# Patient Record
Sex: Female | Born: 1970 | Race: White | Hispanic: No | Smoking: Current every day smoker
Health system: Southern US, Community
[De-identification: ages and names within clinical notes are randomized; demographics above are authoritative.]

## PROBLEM LIST (undated history)

## (undated) DIAGNOSIS — F32A Depression, unspecified: Secondary | ICD-10-CM

## (undated) DIAGNOSIS — G4700 Insomnia, unspecified: Secondary | ICD-10-CM

## (undated) DIAGNOSIS — I10 Essential (primary) hypertension: Secondary | ICD-10-CM

## (undated) DIAGNOSIS — F329 Major depressive disorder, single episode, unspecified: Secondary | ICD-10-CM

## (undated) HISTORY — PX: PARTIAL HYSTERECTOMY: SHX80

## (undated) HISTORY — DX: Depression, unspecified: F32.A

## (undated) HISTORY — DX: Insomnia, unspecified: G47.00

## (undated) HISTORY — PX: DILATION AND CURETTAGE OF UTERUS: SHX78

## (undated) HISTORY — PX: CHOLECYSTECTOMY: SHX55

## (undated) HISTORY — PX: ABDOMINAL HYSTERECTOMY: SHX81

## (undated) HISTORY — PX: APPENDECTOMY: SHX54

---
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
## 1998-07-11 ENCOUNTER — Other Ambulatory Visit: Admission: RE | Admit: 1998-07-11 | Discharge: 1998-07-11 | Payer: Self-pay | Admitting: Obstetrics and Gynecology

## 1999-06-11 ENCOUNTER — Other Ambulatory Visit: Admission: RE | Admit: 1999-06-11 | Discharge: 1999-06-11 | Payer: Self-pay | Admitting: Obstetrics and Gynecology

## 2000-07-21 ENCOUNTER — Other Ambulatory Visit: Admission: RE | Admit: 2000-07-21 | Discharge: 2000-07-21 | Payer: Self-pay | Admitting: Obstetrics and Gynecology

## 2001-09-04 ENCOUNTER — Other Ambulatory Visit: Admission: RE | Admit: 2001-09-04 | Discharge: 2001-09-04 | Payer: Self-pay | Admitting: Obstetrics and Gynecology

## 2003-02-26 ENCOUNTER — Other Ambulatory Visit: Admission: RE | Admit: 2003-02-26 | Discharge: 2003-02-26 | Payer: Self-pay | Admitting: Obstetrics and Gynecology

## 2003-02-27 ENCOUNTER — Encounter: Admission: RE | Admit: 2003-02-27 | Discharge: 2003-02-27 | Payer: Self-pay | Admitting: Obstetrics and Gynecology

## 2003-02-27 ENCOUNTER — Encounter: Payer: Self-pay | Admitting: Obstetrics and Gynecology

## 2003-08-05 ENCOUNTER — Ambulatory Visit (HOSPITAL_COMMUNITY): Admission: RE | Admit: 2003-08-05 | Discharge: 2003-08-05 | Payer: Self-pay | Admitting: Obstetrics and Gynecology

## 2003-08-06 ENCOUNTER — Encounter (INDEPENDENT_AMBULATORY_CARE_PROVIDER_SITE_OTHER): Payer: Self-pay | Admitting: Specialist

## 2003-08-06 ENCOUNTER — Ambulatory Visit (HOSPITAL_COMMUNITY): Admission: RE | Admit: 2003-08-06 | Discharge: 2003-08-06 | Payer: Self-pay | Admitting: Obstetrics and Gynecology

## 2004-08-25 ENCOUNTER — Other Ambulatory Visit: Admission: RE | Admit: 2004-08-25 | Discharge: 2004-08-25 | Payer: Self-pay | Admitting: Obstetrics and Gynecology

## 2004-10-14 ENCOUNTER — Observation Stay (HOSPITAL_COMMUNITY): Admission: RE | Admit: 2004-10-14 | Discharge: 2004-10-15 | Payer: Self-pay | Admitting: Obstetrics and Gynecology

## 2004-10-14 ENCOUNTER — Encounter (INDEPENDENT_AMBULATORY_CARE_PROVIDER_SITE_OTHER): Payer: Self-pay | Admitting: *Deleted

## 2005-12-04 ENCOUNTER — Inpatient Hospital Stay (HOSPITAL_COMMUNITY): Admission: EM | Admit: 2005-12-04 | Discharge: 2005-12-06 | Payer: Self-pay | Admitting: Psychiatry

## 2005-12-05 ENCOUNTER — Ambulatory Visit: Payer: Self-pay | Admitting: Psychiatry

## 2006-09-07 ENCOUNTER — Inpatient Hospital Stay (HOSPITAL_COMMUNITY): Admission: AD | Admit: 2006-09-07 | Discharge: 2006-09-07 | Payer: Self-pay | Admitting: Obstetrics and Gynecology
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
## 2010-10-23 NOTE — Discharge Summary (Signed)
[name], [name]                  ACCOUNT NO.:  [id number]   MEDICAL RECORD NO.:  [id number]          PATIENT TYPE:  OBV   LOCATION:  9316                          [facility]ILITY:  [facility]   PHYSICIAN:  Malva Limes, M.D.    DATE OF BIRTH:  1970-10-11   DATE OF ADMISSION:  10/14/2004  DATE OF DISCHARGE:  10/15/2004                                 DISCHARGE SUMMARY   PRINCIPAL DISCHARGE DIAGNOSES:  1. Severe dysmenorrhea.  2. Menorrhagia.     PRINCIPAL PROCEDURE:  Total vaginal hysterectomy.   HISTORY OF PRESENT ILLNESS:  Ms. Alicia Krueger is a 40 year old white female, G2, P2,  who presented to the operating room on Oct 14, 2004, for a total vaginal  hysterectomy secondary to a multiple-year history of worsening menorrhagia  and dysmenorrhea.  The patient has tried the use of oral contraceptive pills  and the Mirena IUD with little relief.  She misses work at least every other  month because of this discomfort.  The patient has also had a D&C,  hysteroscopy with benign pathology.  She has normal thyroid studies.   HOSPITAL COURSE:  The patient was admitted and underwent total vaginal  hysterectomy without complications.  A complete description of this can be  found in the dictated operative note.  The patient's estimated blood loss  was 150 mL.  The patient's uterus was noted to be adherent to the anterior  abdominal wall secondary to a past history of a cesarean section.  The  patient's postop course was uncomplicated except for a reaction to morphine.  The patient had significant pruritus from the morphine, which stopped after  the PCA was discontinued.  On postoperative day #1, the patient was eating a  regular diet.  She was ambulating without difficulty.  Her postoperative  hemoglobin was 11.5.  The patient's pathology was benign.  She did have  minimal adenomyosis.  The patient was discharged to home.  She was sent home  with Percocet.  She was instructed to follow up in the office in one  week.      MA/MEDQ  D:  11/04/2004  T:  11/04/2004  Job:  [id number]

## 2010-10-23 NOTE — Op Note (Signed)
NAMEFREDERICK, Alicia Krueger                  ACCOUNT NO.:  [id number]   MEDICAL RECORD NO.:  [id number]          PATIENT TYPE:  OBV   LOCATION:  9399                          [facility]   PHYSICIAN:  Malva Limes, M.D.    DATE OF BIRTH:  1970-07-11   DATE OF PROCEDURE:  10/14/2004  DATE OF DISCHARGE:                                 OPERATIVE REPORT   PREOPERATIVE DIAGNOSES:  1.  Severe dysmenorrhea.  2.  Menorrhagia.   POSTOPERATIVE DIAGNOSES:  1.  Severe dysmenorrhea.  2.  Menorrhagia.   PROCEDURE:  Total vaginal hysterectomy.   SURGEON:  Malva Limes, M.D.   ASSISTANT:  Randye Lobo, M.D.   ANESTHESIA:  General endotracheal.   ANTIBIOTICS:  Clindamycin 900 mg.   ESTIMATED BLOOD LOSS:  150 mL.   COMPLICATIONS:  None.   SPECIMENS:  Cervix and uterus to pathology.   DRAINS:  Foley to bedside drainage.   PROCEDURE:  The patient was taken to the operating room, where a general  anesthetic was administered without complications.  She was then placed in  the dorsal lithotomy position and she was prepped with Hibiclens and draped  in the usual fashion for this procedure.  Her bladder was drained with a red  rubber catheter.  A weighted speculum was placed in the vagina.  The cervix  was injected with 1% lidocaine with epinephrine.  The posterior cul-de-sac  was entered sharply.  The uterosacral ligaments were bilaterally clamped,  cut and ligated with 0 Monocryl suture.  The cervix was then circumscribed.  The bladder pillars were bilaterally clamped, cut and ligated with 0  Monocryl suture.  The cardinal ligaments were bilaterally clamped, cut and  ligated with 0 Monocryl suture.  On an attempt to enter the anterior cul-de-  sac, it was noted that the uterus was adherent all the way to the fundus to  the anterior abdominal wall from the previous cesarean section.  Sharp  dissection was performed until the fundus was reached and the peritoneal  cavity was entered.  Following  this, the uterine vessels were bilaterally  clamped, cut and ligated with 0 Monocryl suture.  Once the level of the  round ligaments was reached, the uterus was inverted and the triple pedicle  bilaterally clamped, cut and ligated with 0 Monocryl suture.  It was noted  that there was some bleeding on  the left triple pedicle there, and a second suture was placed.  At the  conclusion of this, it was felt to be hemostatic.  At this point the  pedicles were checked and found to be hemostatic.  The vagina was closed  using 2-0 Vicryl in a running locking fashion.  The parietal peritoneum was  not closed.      MA/MEDQ  D:  10/14/2004  T:  10/14/2004  Job:  [id number]

## 2010-10-23 NOTE — Discharge Summary (Signed)
Alicia Krueger, [name]                  ACCOUNT NO.:  [id number]   MEDICAL RECORD NO.:  [id number]          PATIENT TYPE:  IPS   LOCATION:  0300                          [facility]ACILITY:  [facility]   PHYSICIAN:  Anselm Jungling, MD  DATE OF BIRTH:  06-01-1971   DATE OF ADMISSION:  12/04/2005  DATE OF DISCHARGE:  12/06/2005                                 DISCHARGE SUMMARY   IDENTIFYING DATA AND REASON FOR ADMISSION:  This was an inpatient  psychiatric admission for Alicia Krueger, a 40 year old woman who was admitted for  treatment of depression, following an overdose that occurred on June 28 on  Tylenol PM.  She had been admitted to [facility] ICU for treatment of  that overdose, and was also found to have an E. coli urinary tract  infection.  Treatment for that was started with Levaquin.  The patient  reported that she had been having increasing depression and anxiety over the  past year since a hysterectomy.  She had no prior inpatient treatment  history.  She had had treatment in the form of counseling after a divorce 11  years prior to admission and had had a previous history of taking Zoloft  after the birth of her second child.  Please refer to the admission note for  further details pertaining to the symptoms, circumstances, and history that  led to her hospitalization.  She was given an initial Axis I diagnosis of  depressive disorder, NOS.   MEDICAL AND LABORATORY:  As referenced above, the patient was treated for  her overdose at [facility]  Upon admission to our service, she was  medically and physically assessed by the psychiatric nurse practitioner.  She was continued on Levaquin 750 mg daily.  Aside from this, there were no  significant medical issues.   HOSPITAL COURSE:  The patient was admitted to the adult inpatient  psychiatric service.  She presented as a well-nourished, well-developed  adult female who was depressed with sad affect.  Thoughts and speech were  normally  organized.  There was nothing to suggest any underlying psychosis  or thought disorder.  She was absent suicidal ideation throughout her  inpatient stay.  She was glad she survived her overdose and felt badly about  it.   She was placed on a regimen of Zoloft 150 mg daily and Seroquel 50 mg at  bedtime to assist with sleep.  These were both well tolerated.  The addition  of Ativan 1 mg at bedtime further improved her sleep.   By the third hospital day, the patient felt that she was ready to go home.  She was open to continuing medication and outpatient counseling.  She was  absent suicidal ideation and in good spirits.  She commented that the time  she spent on the inpatient service helped her to gain perspective on her own  life, and decided that her problems were not that bad.   AFTERCARE:  The patient was discharged with a plan to follow up with Bartholomew Crews on December 21, 2005, and Dr. __________  on December 07, 2005.   DISCHARGE MEDICATIONS:  1.  Seroquel 50 mg q.h.s.  2.  Zoloft 150 mg daily.  3.  Levaquin 750 mg daily  4.  Ativan 1 mg q.h.s.   DISCHARGE DIAGNOSES:  AXIS I:  Major depressive episode.  AXIS II:  Deferred.  AXIS III:  Urinary tract infection.  AXIS IV:  Stressors severe.  AXIS V:  Global Assessment of Functioning on discharge 65.           ______________________________  Anselm Jungling, MD  Electronically Signed     SPB/MEDQ  D:  12/07/2005  T:  12/07/2005  Job:  [id number]

## 2010-10-23 NOTE — Op Note (Signed)
NAMEPAULLA, MCCLASKEY                            ACCOUNT NO.:  [id number]   MEDICAL RECORD NO.:  [id number]                   PATIENT TYPE:  AMB   LOCATION:  SDC                                  [facility]ILITY:  [facility]   PHYSICIAN:  Malva Limes, M.D.                 DATE OF BIRTH:  1971/04/20   DATE OF PROCEDURE:  08/06/2003  DATE OF DISCHARGE:                                 OPERATIVE REPORT   PREOPERATIVE DIAGNOSIS:  Menorrhagia.   POSTOPERATIVE DIAGNOSIS:  Menorrhagia.   PROCEDURES:  1. Hysteroscopy.  2. Dilatation and curettage.   SURGEON:  Malva Limes, M.D.   ANESTHESIA:  MAC with paracervical block.   ESTIMATED BLOOD LOSS:  20 mL.   COMPLICATIONS:  None.   SPECIMENS:  Endometrial curettings sent to pathology.   DRAINS:  Red rubber catheter to bladder.   DESCRIPTION OF PROCEDURE:  The patient was taken to the operating room where  she was placed in the dorsal lithotomy position.  She was prepped with  Hibiclens and her bladder was drained with a red rubber catheter.  The  patient was draped in the usual fashion for this procedure.  Sterile  speculum was placed in the vagina.  Lidocaine 1% was used for paracervical  block.  The cervix was grasped with single-tooth tenaculum.  The uterus was  sounded to 7 cm.  The cervix was dilated to a 20 [place].  The hysteroscope  was advanced through the endocervical canal which appeared to be normal.  On  entering the uterine cavity, both ostia were easily visualized.  There was  no evidence of any uterine polyps or fibroids.  At this point, the  hysteroscope was removed and sharp curettage was performed.  The tissue was  sent to the lab.  This concluded the procedure.  The patient was taken to  the recovery room in stable condition.  She will be discharged to home.  She  will be sent home with Darvocet to take p.r.n.  She was instructed to follow  up in the office in four weeks.                                               Malva Limes, M.D.    MA/MEDQ  D:  08/06/2003  T:  08/06/2003  Job:  [id number]

## 2016-09-08 DIAGNOSIS — R079 Chest pain, unspecified: Secondary | ICD-10-CM | POA: Diagnosis not present

## 2016-09-08 DIAGNOSIS — N3000 Acute cystitis without hematuria: Secondary | ICD-10-CM | POA: Diagnosis not present

## 2016-09-08 DIAGNOSIS — Z01419 Encounter for gynecological examination (general) (routine) without abnormal findings: Secondary | ICD-10-CM | POA: Diagnosis not present

## 2016-09-08 DIAGNOSIS — E6609 Other obesity due to excess calories: Secondary | ICD-10-CM | POA: Diagnosis not present

## 2016-09-08 DIAGNOSIS — F17200 Nicotine dependence, unspecified, uncomplicated: Secondary | ICD-10-CM | POA: Diagnosis not present

## 2016-09-08 DIAGNOSIS — Z1389 Encounter for screening for other disorder: Secondary | ICD-10-CM | POA: Diagnosis not present

## 2016-09-08 DIAGNOSIS — Z6835 Body mass index (BMI) 35.0-35.9, adult: Secondary | ICD-10-CM | POA: Diagnosis not present

## 2016-09-08 DIAGNOSIS — Z113 Encounter for screening for infections with a predominantly sexual mode of transmission: Secondary | ICD-10-CM | POA: Diagnosis not present

## 2016-09-09 DIAGNOSIS — R079 Chest pain, unspecified: Secondary | ICD-10-CM | POA: Diagnosis not present

## 2016-09-09 DIAGNOSIS — Z01419 Encounter for gynecological examination (general) (routine) without abnormal findings: Secondary | ICD-10-CM | POA: Diagnosis not present

## 2016-09-09 DIAGNOSIS — F17200 Nicotine dependence, unspecified, uncomplicated: Secondary | ICD-10-CM | POA: Diagnosis not present

## 2016-09-09 DIAGNOSIS — E6609 Other obesity due to excess calories: Secondary | ICD-10-CM | POA: Diagnosis not present

## 2016-11-27 DIAGNOSIS — N3001 Acute cystitis with hematuria: Secondary | ICD-10-CM | POA: Diagnosis not present

## 2016-12-27 DIAGNOSIS — N3000 Acute cystitis without hematuria: Secondary | ICD-10-CM | POA: Diagnosis not present

## 2016-12-27 DIAGNOSIS — N761 Subacute and chronic vaginitis: Secondary | ICD-10-CM | POA: Diagnosis not present

## 2017-01-07 DIAGNOSIS — N3000 Acute cystitis without hematuria: Secondary | ICD-10-CM | POA: Diagnosis not present

## 2017-01-07 DIAGNOSIS — R509 Fever, unspecified: Secondary | ICD-10-CM | POA: Diagnosis not present

## 2017-01-07 DIAGNOSIS — M545 Low back pain: Secondary | ICD-10-CM | POA: Diagnosis not present

## 2017-03-29 DIAGNOSIS — Z6836 Body mass index (BMI) 36.0-36.9, adult: Secondary | ICD-10-CM | POA: Diagnosis not present

## 2017-03-29 DIAGNOSIS — F419 Anxiety disorder, unspecified: Secondary | ICD-10-CM | POA: Diagnosis not present

## 2017-03-29 DIAGNOSIS — R350 Frequency of micturition: Secondary | ICD-10-CM | POA: Diagnosis not present

## 2017-03-29 DIAGNOSIS — G4700 Insomnia, unspecified: Secondary | ICD-10-CM | POA: Diagnosis not present

## 2017-05-02 DIAGNOSIS — G4700 Insomnia, unspecified: Secondary | ICD-10-CM | POA: Diagnosis not present

## 2017-05-02 DIAGNOSIS — F419 Anxiety disorder, unspecified: Secondary | ICD-10-CM | POA: Diagnosis not present

## 2017-05-02 DIAGNOSIS — Z6837 Body mass index (BMI) 37.0-37.9, adult: Secondary | ICD-10-CM | POA: Diagnosis not present

## 2017-05-02 DIAGNOSIS — J029 Acute pharyngitis, unspecified: Secondary | ICD-10-CM | POA: Diagnosis not present

## 2017-06-13 DIAGNOSIS — Z6838 Body mass index (BMI) 38.0-38.9, adult: Secondary | ICD-10-CM | POA: Diagnosis not present

## 2017-06-13 DIAGNOSIS — F419 Anxiety disorder, unspecified: Secondary | ICD-10-CM | POA: Diagnosis not present

## 2017-06-13 DIAGNOSIS — G4700 Insomnia, unspecified: Secondary | ICD-10-CM | POA: Diagnosis not present

## 2017-08-08 DIAGNOSIS — R1011 Right upper quadrant pain: Secondary | ICD-10-CM | POA: Diagnosis not present

## 2017-08-08 DIAGNOSIS — Z6838 Body mass index (BMI) 38.0-38.9, adult: Secondary | ICD-10-CM | POA: Diagnosis not present

## 2017-08-10 DIAGNOSIS — Z6838 Body mass index (BMI) 38.0-38.9, adult: Secondary | ICD-10-CM | POA: Diagnosis not present

## 2017-08-10 DIAGNOSIS — R1011 Right upper quadrant pain: Secondary | ICD-10-CM | POA: Diagnosis not present

## 2017-08-10 DIAGNOSIS — G4700 Insomnia, unspecified: Secondary | ICD-10-CM | POA: Diagnosis not present

## 2017-08-10 DIAGNOSIS — F419 Anxiety disorder, unspecified: Secondary | ICD-10-CM | POA: Diagnosis not present

## 2017-08-12 DIAGNOSIS — K8020 Calculus of gallbladder without cholecystitis without obstruction: Secondary | ICD-10-CM | POA: Diagnosis not present

## 2017-08-12 DIAGNOSIS — R1011 Right upper quadrant pain: Secondary | ICD-10-CM | POA: Diagnosis not present

## 2017-09-27 DIAGNOSIS — K8050 Calculus of bile duct without cholangitis or cholecystitis without obstruction: Secondary | ICD-10-CM | POA: Diagnosis not present

## 2017-09-28 DIAGNOSIS — R1013 Epigastric pain: Secondary | ICD-10-CM | POA: Diagnosis not present

## 2017-09-28 DIAGNOSIS — R1011 Right upper quadrant pain: Secondary | ICD-10-CM | POA: Diagnosis not present

## 2017-09-28 DIAGNOSIS — K8010 Calculus of gallbladder with chronic cholecystitis without obstruction: Secondary | ICD-10-CM | POA: Diagnosis not present

## 2017-09-30 DIAGNOSIS — K828 Other specified diseases of gallbladder: Secondary | ICD-10-CM | POA: Diagnosis not present

## 2017-09-30 DIAGNOSIS — K8010 Calculus of gallbladder with chronic cholecystitis without obstruction: Secondary | ICD-10-CM | POA: Diagnosis not present

## 2017-11-12 DIAGNOSIS — N3090 Cystitis, unspecified without hematuria: Secondary | ICD-10-CM | POA: Diagnosis not present

## 2017-11-12 DIAGNOSIS — J0100 Acute maxillary sinusitis, unspecified: Secondary | ICD-10-CM | POA: Diagnosis not present

## 2017-11-17 DIAGNOSIS — G4700 Insomnia, unspecified: Secondary | ICD-10-CM | POA: Diagnosis not present

## 2017-11-17 DIAGNOSIS — R197 Diarrhea, unspecified: Secondary | ICD-10-CM | POA: Diagnosis not present

## 2017-11-17 DIAGNOSIS — Z6838 Body mass index (BMI) 38.0-38.9, adult: Secondary | ICD-10-CM | POA: Diagnosis not present

## 2017-11-17 DIAGNOSIS — R110 Nausea: Secondary | ICD-10-CM | POA: Diagnosis not present

## 2017-12-06 DIAGNOSIS — R1013 Epigastric pain: Secondary | ICD-10-CM | POA: Diagnosis not present

## 2017-12-06 DIAGNOSIS — R112 Nausea with vomiting, unspecified: Secondary | ICD-10-CM | POA: Diagnosis not present

## 2017-12-06 DIAGNOSIS — R1110 Vomiting, unspecified: Secondary | ICD-10-CM | POA: Diagnosis not present

## 2017-12-06 DIAGNOSIS — R109 Unspecified abdominal pain: Secondary | ICD-10-CM | POA: Diagnosis not present

## 2017-12-22 DIAGNOSIS — R8290 Unspecified abnormal findings in urine: Secondary | ICD-10-CM | POA: Diagnosis not present

## 2017-12-22 DIAGNOSIS — N3000 Acute cystitis without hematuria: Secondary | ICD-10-CM | POA: Diagnosis not present

## 2017-12-26 DIAGNOSIS — D72829 Elevated white blood cell count, unspecified: Secondary | ICD-10-CM | POA: Diagnosis not present

## 2017-12-26 DIAGNOSIS — R0602 Shortness of breath: Secondary | ICD-10-CM | POA: Diagnosis not present

## 2017-12-26 DIAGNOSIS — N949 Unspecified condition associated with female genital organs and menstrual cycle: Secondary | ICD-10-CM | POA: Diagnosis not present

## 2017-12-26 DIAGNOSIS — R5383 Other fatigue: Secondary | ICD-10-CM | POA: Diagnosis not present

## 2017-12-26 DIAGNOSIS — R05 Cough: Secondary | ICD-10-CM | POA: Diagnosis not present

## 2017-12-26 DIAGNOSIS — R0981 Nasal congestion: Secondary | ICD-10-CM | POA: Diagnosis not present

## 2017-12-26 DIAGNOSIS — R112 Nausea with vomiting, unspecified: Secondary | ICD-10-CM | POA: Diagnosis not present

## 2017-12-26 DIAGNOSIS — R1110 Vomiting, unspecified: Secondary | ICD-10-CM | POA: Diagnosis not present

## 2018-01-16 DIAGNOSIS — Z6839 Body mass index (BMI) 39.0-39.9, adult: Secondary | ICD-10-CM | POA: Diagnosis not present

## 2018-01-16 DIAGNOSIS — I10 Essential (primary) hypertension: Secondary | ICD-10-CM | POA: Diagnosis not present

## 2018-01-16 DIAGNOSIS — G4700 Insomnia, unspecified: Secondary | ICD-10-CM | POA: Diagnosis not present

## 2018-01-16 DIAGNOSIS — F419 Anxiety disorder, unspecified: Secondary | ICD-10-CM | POA: Diagnosis not present

## 2018-02-14 DIAGNOSIS — R234 Changes in skin texture: Secondary | ICD-10-CM | POA: Diagnosis not present

## 2018-02-14 DIAGNOSIS — F419 Anxiety disorder, unspecified: Secondary | ICD-10-CM | POA: Diagnosis not present

## 2018-02-14 DIAGNOSIS — G4700 Insomnia, unspecified: Secondary | ICD-10-CM | POA: Diagnosis not present

## 2018-02-14 DIAGNOSIS — I10 Essential (primary) hypertension: Secondary | ICD-10-CM | POA: Diagnosis not present

## 2018-02-28 DIAGNOSIS — L710 Perioral dermatitis: Secondary | ICD-10-CM | POA: Diagnosis not present

## 2018-02-28 DIAGNOSIS — L301 Dyshidrosis [pompholyx]: Secondary | ICD-10-CM | POA: Diagnosis not present

## 2018-03-02 DIAGNOSIS — R1011 Right upper quadrant pain: Secondary | ICD-10-CM | POA: Diagnosis not present

## 2018-03-14 DIAGNOSIS — Z6839 Body mass index (BMI) 39.0-39.9, adult: Secondary | ICD-10-CM | POA: Diagnosis not present

## 2018-03-14 DIAGNOSIS — R110 Nausea: Secondary | ICD-10-CM | POA: Diagnosis not present

## 2018-03-14 DIAGNOSIS — R51 Headache: Secondary | ICD-10-CM | POA: Diagnosis not present

## 2018-03-27 DIAGNOSIS — R1011 Right upper quadrant pain: Secondary | ICD-10-CM | POA: Diagnosis not present

## 2018-03-27 DIAGNOSIS — K253 Acute gastric ulcer without hemorrhage or perforation: Secondary | ICD-10-CM | POA: Diagnosis not present

## 2018-03-30 DIAGNOSIS — Z6839 Body mass index (BMI) 39.0-39.9, adult: Secondary | ICD-10-CM | POA: Diagnosis not present

## 2018-03-30 DIAGNOSIS — F419 Anxiety disorder, unspecified: Secondary | ICD-10-CM | POA: Diagnosis not present

## 2018-03-30 DIAGNOSIS — G4700 Insomnia, unspecified: Secondary | ICD-10-CM | POA: Diagnosis not present

## 2018-03-30 DIAGNOSIS — G4710 Hypersomnia, unspecified: Secondary | ICD-10-CM | POA: Diagnosis not present

## 2018-04-20 DIAGNOSIS — Z1331 Encounter for screening for depression: Secondary | ICD-10-CM | POA: Diagnosis not present

## 2018-04-20 DIAGNOSIS — F419 Anxiety disorder, unspecified: Secondary | ICD-10-CM | POA: Diagnosis not present

## 2018-04-20 DIAGNOSIS — Z23 Encounter for immunization: Secondary | ICD-10-CM | POA: Diagnosis not present

## 2018-04-20 DIAGNOSIS — G4710 Hypersomnia, unspecified: Secondary | ICD-10-CM | POA: Diagnosis not present

## 2018-04-20 DIAGNOSIS — G4700 Insomnia, unspecified: Secondary | ICD-10-CM | POA: Diagnosis not present

## 2018-04-20 DIAGNOSIS — K580 Irritable bowel syndrome with diarrhea: Secondary | ICD-10-CM | POA: Diagnosis not present

## 2018-06-06 DIAGNOSIS — R350 Frequency of micturition: Secondary | ICD-10-CM | POA: Diagnosis not present

## 2018-06-06 DIAGNOSIS — G4700 Insomnia, unspecified: Secondary | ICD-10-CM | POA: Diagnosis not present

## 2018-06-06 DIAGNOSIS — R530 Neoplastic (malignant) related fatigue: Secondary | ICD-10-CM | POA: Diagnosis not present

## 2018-06-06 DIAGNOSIS — F419 Anxiety disorder, unspecified: Secondary | ICD-10-CM | POA: Diagnosis not present

## 2018-06-06 DIAGNOSIS — G4710 Hypersomnia, unspecified: Secondary | ICD-10-CM | POA: Diagnosis not present

## 2018-08-21 DIAGNOSIS — N3000 Acute cystitis without hematuria: Secondary | ICD-10-CM | POA: Diagnosis not present

## 2018-08-21 DIAGNOSIS — J069 Acute upper respiratory infection, unspecified: Secondary | ICD-10-CM | POA: Diagnosis not present

## 2018-09-04 DIAGNOSIS — F419 Anxiety disorder, unspecified: Secondary | ICD-10-CM | POA: Diagnosis not present

## 2018-09-04 DIAGNOSIS — R350 Frequency of micturition: Secondary | ICD-10-CM | POA: Diagnosis not present

## 2018-09-04 DIAGNOSIS — G4700 Insomnia, unspecified: Secondary | ICD-10-CM | POA: Diagnosis not present

## 2018-09-04 DIAGNOSIS — I10 Essential (primary) hypertension: Secondary | ICD-10-CM | POA: Diagnosis not present

## 2018-09-08 DIAGNOSIS — N3946 Mixed incontinence: Secondary | ICD-10-CM | POA: Diagnosis not present

## 2018-09-08 DIAGNOSIS — R351 Nocturia: Secondary | ICD-10-CM | POA: Diagnosis not present

## 2018-11-06 DIAGNOSIS — M25471 Effusion, right ankle: Secondary | ICD-10-CM | POA: Diagnosis not present

## 2018-11-06 DIAGNOSIS — G4700 Insomnia, unspecified: Secondary | ICD-10-CM | POA: Diagnosis not present

## 2018-11-06 DIAGNOSIS — Z79899 Other long term (current) drug therapy: Secondary | ICD-10-CM | POA: Diagnosis not present

## 2018-11-06 DIAGNOSIS — R109 Unspecified abdominal pain: Secondary | ICD-10-CM | POA: Diagnosis not present

## 2018-11-06 DIAGNOSIS — F419 Anxiety disorder, unspecified: Secondary | ICD-10-CM | POA: Diagnosis not present

## 2018-12-16 DIAGNOSIS — R42 Dizziness and giddiness: Secondary | ICD-10-CM | POA: Diagnosis not present

## 2019-05-09 DIAGNOSIS — R519 Headache, unspecified: Secondary | ICD-10-CM | POA: Diagnosis not present

## 2019-05-09 DIAGNOSIS — G4700 Insomnia, unspecified: Secondary | ICD-10-CM | POA: Diagnosis not present

## 2019-05-09 DIAGNOSIS — I10 Essential (primary) hypertension: Secondary | ICD-10-CM | POA: Diagnosis not present

## 2019-05-09 DIAGNOSIS — R110 Nausea: Secondary | ICD-10-CM | POA: Diagnosis not present

## 2019-05-10 DIAGNOSIS — Z1159 Encounter for screening for other viral diseases: Secondary | ICD-10-CM | POA: Diagnosis not present

## 2019-05-31 DIAGNOSIS — J029 Acute pharyngitis, unspecified: Secondary | ICD-10-CM | POA: Diagnosis not present

## 2019-05-31 DIAGNOSIS — Z20828 Contact with and (suspected) exposure to other viral communicable diseases: Secondary | ICD-10-CM | POA: Diagnosis not present

## 2019-05-31 DIAGNOSIS — R509 Fever, unspecified: Secondary | ICD-10-CM | POA: Diagnosis not present

## 2019-06-11 DIAGNOSIS — Z79899 Other long term (current) drug therapy: Secondary | ICD-10-CM | POA: Diagnosis not present

## 2019-06-11 DIAGNOSIS — F419 Anxiety disorder, unspecified: Secondary | ICD-10-CM | POA: Diagnosis not present

## 2019-06-11 DIAGNOSIS — K259 Gastric ulcer, unspecified as acute or chronic, without hemorrhage or perforation: Secondary | ICD-10-CM | POA: Diagnosis not present

## 2019-06-11 DIAGNOSIS — E785 Hyperlipidemia, unspecified: Secondary | ICD-10-CM | POA: Diagnosis not present

## 2019-06-11 DIAGNOSIS — R110 Nausea: Secondary | ICD-10-CM | POA: Diagnosis not present

## 2019-06-11 DIAGNOSIS — G4700 Insomnia, unspecified: Secondary | ICD-10-CM | POA: Diagnosis not present

## 2019-07-08 DIAGNOSIS — I160 Hypertensive urgency: Secondary | ICD-10-CM | POA: Diagnosis not present

## 2019-07-08 DIAGNOSIS — R112 Nausea with vomiting, unspecified: Secondary | ICD-10-CM | POA: Diagnosis not present

## 2019-07-08 DIAGNOSIS — E860 Dehydration: Secondary | ICD-10-CM | POA: Diagnosis not present

## 2019-07-08 DIAGNOSIS — R739 Hyperglycemia, unspecified: Secondary | ICD-10-CM | POA: Diagnosis not present

## 2019-07-08 DIAGNOSIS — R1013 Epigastric pain: Secondary | ICD-10-CM | POA: Diagnosis not present

## 2019-07-08 DIAGNOSIS — D473 Essential (hemorrhagic) thrombocythemia: Secondary | ICD-10-CM | POA: Diagnosis not present

## 2019-07-08 DIAGNOSIS — D72829 Elevated white blood cell count, unspecified: Secondary | ICD-10-CM | POA: Diagnosis not present

## 2019-07-09 DIAGNOSIS — Z882 Allergy status to sulfonamides status: Secondary | ICD-10-CM | POA: Diagnosis not present

## 2019-07-09 DIAGNOSIS — R739 Hyperglycemia, unspecified: Secondary | ICD-10-CM | POA: Diagnosis not present

## 2019-07-09 DIAGNOSIS — Z79899 Other long term (current) drug therapy: Secondary | ICD-10-CM | POA: Diagnosis not present

## 2019-07-09 DIAGNOSIS — D473 Essential (hemorrhagic) thrombocythemia: Secondary | ICD-10-CM | POA: Diagnosis not present

## 2019-07-09 DIAGNOSIS — E860 Dehydration: Secondary | ICD-10-CM | POA: Diagnosis not present

## 2019-07-09 DIAGNOSIS — R109 Unspecified abdominal pain: Secondary | ICD-10-CM | POA: Diagnosis not present

## 2019-07-09 DIAGNOSIS — F329 Major depressive disorder, single episode, unspecified: Secondary | ICD-10-CM | POA: Diagnosis not present

## 2019-07-09 DIAGNOSIS — I10 Essential (primary) hypertension: Secondary | ICD-10-CM | POA: Diagnosis not present

## 2019-07-09 DIAGNOSIS — R112 Nausea with vomiting, unspecified: Secondary | ICD-10-CM | POA: Diagnosis not present

## 2019-07-09 DIAGNOSIS — R1013 Epigastric pain: Secondary | ICD-10-CM | POA: Diagnosis not present

## 2019-07-09 DIAGNOSIS — K219 Gastro-esophageal reflux disease without esophagitis: Secondary | ICD-10-CM | POA: Diagnosis not present

## 2019-07-09 DIAGNOSIS — A084 Viral intestinal infection, unspecified: Secondary | ICD-10-CM | POA: Diagnosis not present

## 2019-07-09 DIAGNOSIS — E876 Hypokalemia: Secondary | ICD-10-CM | POA: Diagnosis not present

## 2019-07-09 DIAGNOSIS — Z885 Allergy status to narcotic agent status: Secondary | ICD-10-CM | POA: Diagnosis not present

## 2019-07-09 DIAGNOSIS — I160 Hypertensive urgency: Secondary | ICD-10-CM | POA: Diagnosis not present

## 2019-07-09 DIAGNOSIS — Z880 Allergy status to penicillin: Secondary | ICD-10-CM | POA: Diagnosis not present

## 2019-07-09 DIAGNOSIS — F17210 Nicotine dependence, cigarettes, uncomplicated: Secondary | ICD-10-CM | POA: Diagnosis not present

## 2019-07-10 DIAGNOSIS — E860 Dehydration: Secondary | ICD-10-CM | POA: Diagnosis not present

## 2019-07-10 DIAGNOSIS — I160 Hypertensive urgency: Secondary | ICD-10-CM | POA: Diagnosis not present

## 2019-07-10 DIAGNOSIS — R112 Nausea with vomiting, unspecified: Secondary | ICD-10-CM | POA: Diagnosis not present

## 2019-07-10 DIAGNOSIS — R1013 Epigastric pain: Secondary | ICD-10-CM | POA: Diagnosis not present

## 2019-07-11 DIAGNOSIS — R1013 Epigastric pain: Secondary | ICD-10-CM | POA: Diagnosis not present

## 2019-07-11 DIAGNOSIS — I160 Hypertensive urgency: Secondary | ICD-10-CM | POA: Diagnosis not present

## 2019-07-11 DIAGNOSIS — E860 Dehydration: Secondary | ICD-10-CM | POA: Diagnosis not present

## 2019-07-11 DIAGNOSIS — R112 Nausea with vomiting, unspecified: Secondary | ICD-10-CM | POA: Diagnosis not present

## 2019-07-12 DIAGNOSIS — R112 Nausea with vomiting, unspecified: Secondary | ICD-10-CM | POA: Diagnosis not present

## 2019-07-12 DIAGNOSIS — R1013 Epigastric pain: Secondary | ICD-10-CM | POA: Diagnosis not present

## 2019-07-12 DIAGNOSIS — E860 Dehydration: Secondary | ICD-10-CM | POA: Diagnosis not present

## 2019-07-12 DIAGNOSIS — I160 Hypertensive urgency: Secondary | ICD-10-CM | POA: Diagnosis not present

## 2019-07-16 ENCOUNTER — Encounter: Payer: Self-pay | Admitting: Gastroenterology

## 2019-07-23 ENCOUNTER — Encounter: Payer: Self-pay | Admitting: Gastroenterology

## 2019-07-23 ENCOUNTER — Ambulatory Visit: Payer: BC Managed Care – PPO | Admitting: Gastroenterology

## 2019-07-23 VITALS — BP 108/72 | HR 80 | Temp 97.6°F | Ht 64.0 in | Wt 246.6 lb

## 2019-07-23 DIAGNOSIS — D72829 Elevated white blood cell count, unspecified: Secondary | ICD-10-CM | POA: Diagnosis not present

## 2019-07-23 DIAGNOSIS — Z01818 Encounter for other preprocedural examination: Secondary | ICD-10-CM

## 2019-07-23 DIAGNOSIS — R1013 Epigastric pain: Secondary | ICD-10-CM | POA: Diagnosis not present

## 2019-07-23 DIAGNOSIS — R1110 Vomiting, unspecified: Secondary | ICD-10-CM

## 2019-07-23 MED ORDER — OMEPRAZOLE 40 MG PO CPDR: 40.0000 mg | DELAYED_RELEASE_CAPSULE | Freq: Two times a day (BID) | ORAL | 5 refills | Status: DC

## 2019-07-23 MED ORDER — PROMETHAZINE HCL 25 MG RE SUPP: 25.0000 mg | Freq: Four times a day (QID) | RECTAL | 0 refills | Status: DC | PRN

## 2019-07-23 NOTE — Progress Notes (Signed)
07/23/2019 Markay Dorschner [id number] [birth year]   HISTORY OF PRESENT ILLNESS: This is a 49 year old female who is new to our office.  She is here today at the request of her PCP, Greta O'Buch, PA-C, for evaluation regarding episodes of nausea, vomiting, and upper abdominal pain.  Says that it initially began about 2 to 3 years ago after her cholecystectomy.  Reports severe upper abdominal pain with associated profuse vomiting.  She says that the episodes come out of nowhere.  Says that she has almost like a swelling or bloating in her upper mid abdomen as well.  Says that she was diagnosed with ulcers 1.5 to 2 years ago by Dr. Melina Copa.  Was in the ER at [facility] in December 2020 and then recently hospitalized at [facility] earlier this month for the same symptoms.  Had CT scans on both occasions with no definite cause of the patient's symptoms.  Did have plaque resulting in moderate narrowing of the IMA origin and right internal iliac origins.  Was noted to have elevated white blood cell count up to 24.5.  It appears that her white blood cell count stays persistently elevated to some degree.  LFTs normal.  Lipase normal.  Currently she is on omeprazole 20 mg daily and uses Zofran and Phenergan alternating to help with the nausea.  She does use frequent Advil and Motrin about 4/day.  She denies any associated lower GI symptoms.  Says that she moves her bowels fine.  Denies any rectal bleeding.  Patient is requesting Dr. Tarri Glenn.   Past Medical History:  Diagnosis Date  . Depression   . Insomnia    Past Surgical History:  Procedure Laterality Date  . APPENDECTOMY    . CHOLECYSTECTOMY    . DILATION AND CURETTAGE OF UTERUS     x 4  . PARTIAL HYSTERECTOMY      reports that she has been smoking. She has never used smokeless tobacco. She reports current alcohol use. She reports that she does not use drugs. family history includes Pancreatic cancer in her maternal aunt,  maternal grandmother, and maternal uncle; Stomach cancer in her father. Allergies  Allergen Reactions  . Amoxicillin Anaphylaxis  . Penicillins Anaphylaxis  . Sulfa Antibiotics Anaphylaxis      Outpatient Encounter Medications as of 07/23/2019  Medication Sig  . amLODipine (NORVASC) 10 MG tablet Take 10 mg by mouth daily.  . clonazePAM (KLONOPIN) 1 MG tablet Take 1 mg by mouth at bedtime.  . cloNIDine (CATAPRES) 0.2 MG tablet Take 0.2 mg by mouth at bedtime as needed.  . hydrochlorothiazide (MICROZIDE) 12.5 MG capsule Take 12.5 mg by mouth daily.  . hydrOXYzine (ATARAX/VISTARIL) 50 MG tablet Take 50 mg by mouth at bedtime as needed.  . metoCLOPramide (REGLAN) 10 MG tablet Take 10 mg by mouth daily.  . metoprolol succinate (TOPROL-XL) 50 MG 24 hr tablet Take 50 mg by mouth daily. Take with or immediately following a meal.  . mirtazapine (REMERON) 30 MG tablet Take 30 mg by mouth at bedtime.  Marland Kitchen omeprazole (PRILOSEC) 20 MG capsule Take 20 mg by mouth daily.  . ondansetron (ZOFRAN-ODT) 4 MG disintegrating tablet Take 4 mg by mouth every 8 (eight) hours as needed for nausea or vomiting.  Marland Kitchen PARoxetine (PAXIL) 20 MG tablet Take 20 mg by mouth daily.  . promethazine (PHENERGAN) 25 MG tablet Take 25 mg by mouth every 6 (six) hours as needed for nausea or vomiting.  . SUMAtriptan (IMITREX) 100  MG tablet Take 100 mg by mouth every 2 (two) hours as needed for migraine. May repeat in 2 hours if headache persists or recurs.  . temazepam (RESTORIL) 30 MG capsule Take 30 mg by mouth at bedtime as needed for sleep.  Marland Kitchen triamcinolone cream (KENALOG) 0.1 % Apply 1 application topically as needed.   No facility-administered encounter medications on file as of 07/23/2019.     REVIEW OF SYSTEMS  : All other systems reviewed and negative except where noted in the History of Present Illness.   PHYSICAL EXAM: BP 108/72   Pulse 80   Temp 97.6 F (36.4 C)   Ht 5\' 4"  (1.626 m)   Wt 246 lb 9.6 oz (111.9 kg)    BMI 42.33 kg/m  General: Well developed white female in no acute distress Head: Normocephalic and atraumatic Eyes:  Sclerae anicteric, conjunctiva pink. Ears: Normal auditory acuity Lungs: Clear throughout to auscultation; no increased WOB. Heart: Regular rate and rhythm; no M/R/G. Abdomen: Soft, non-distended.  BS present.  Mild upper abdominal TTP. Musculoskeletal: Symmetrical with no gross deformities  Skin: No lesions on visible extremities Extremities: No edema  Neurological: Alert oriented x 4, grossly non-focal Psychological:  Alert and cooperative. Normal mood and affect  ASSESSMENT AND PLAN: *Episodes of epigastric/right upper quadrant abdominal pain and vomiting: Says that it initially began about 2 to 3 years ago after her cholecystectomy.  Says that she was diagnosed with ulcers 1.5 to 2 years ago by Dr. Melina Copa.  Recent CT scan of the abdomen and pelvis with contrast at [facility] showed no definite cause of the patient's symptoms.  Did have plaque resulting in moderate narrowing of the IMA origin and right internal iliac origins.  Also noted to have elevated white blood cell count up to 24.5.  LFTs normal.  Lipase normal.  At this point not sure what is causing her symptoms.  We will start by increasing her PPI to 40 mg twice daily and we will plan for EGD with Dr. Tarri Glenn. *Leukocytosis: No apparent sign of infections have been found.  May need to see hematology.  **Records that we have obtained from [facility] are being sent for scanning.  **The risks, benefits, and alternatives to EGD were discussed with the patient and she consents to proceed.   CC:  O'Buch, Greta, PA-C

## 2019-07-23 NOTE — Patient Instructions (Signed)
We have sent the following medications to your pharmacy for you to pick up at your convenience: omeprazole 40 mg twice daily.   You have been scheduled for an endoscopy. Please follow written instructions given to you at your visit today. If you use inhalers (even only as needed), please bring them with you on the day of your procedure.

## 2019-07-30 ENCOUNTER — Encounter (HOSPITAL_COMMUNITY): Payer: Self-pay | Admitting: Emergency Medicine

## 2019-07-30 ENCOUNTER — Inpatient Hospital Stay (HOSPITAL_COMMUNITY)
Admission: EM | Admit: 2019-07-30 | Discharge: 2019-08-02 | DRG: 392 | Disposition: A | Discharge status: Home / Self Care | Payer: BC Managed Care – PPO | Attending: Internal Medicine | Admitting: Internal Medicine

## 2019-07-30 ENCOUNTER — Encounter: Payer: Self-pay | Admitting: Gastroenterology

## 2019-07-30 ENCOUNTER — Encounter: Payer: Self-pay | Admitting: Internal Medicine

## 2019-07-30 ENCOUNTER — Observation Stay (HOSPITAL_COMMUNITY): Payer: BC Managed Care – PPO

## 2019-07-30 ENCOUNTER — Other Ambulatory Visit: Payer: Self-pay

## 2019-07-30 DIAGNOSIS — R1013 Epigastric pain: Secondary | ICD-10-CM | POA: Insufficient documentation

## 2019-07-30 DIAGNOSIS — K253 Acute gastric ulcer without hemorrhage or perforation: Secondary | ICD-10-CM | POA: Diagnosis not present

## 2019-07-30 DIAGNOSIS — Z90711 Acquired absence of uterus with remaining cervical stump: Secondary | ICD-10-CM

## 2019-07-30 DIAGNOSIS — R112 Nausea with vomiting, unspecified: Secondary | ICD-10-CM | POA: Diagnosis not present

## 2019-07-30 DIAGNOSIS — K2990 Gastroduodenitis, unspecified, without bleeding: Secondary | ICD-10-CM | POA: Diagnosis not present

## 2019-07-30 DIAGNOSIS — I10 Essential (primary) hypertension: Secondary | ICD-10-CM | POA: Diagnosis not present

## 2019-07-30 DIAGNOSIS — R000 Tachycardia, unspecified: Secondary | ICD-10-CM | POA: Diagnosis not present

## 2019-07-30 DIAGNOSIS — K269 Duodenal ulcer, unspecified as acute or chronic, without hemorrhage or perforation: Secondary | ICD-10-CM | POA: Diagnosis not present

## 2019-07-30 DIAGNOSIS — Z6841 Body Mass Index (BMI) 40.0 and over, adult: Secondary | ICD-10-CM | POA: Diagnosis not present

## 2019-07-30 DIAGNOSIS — Z20822 Contact with and (suspected) exposure to covid-19: Secondary | ICD-10-CM | POA: Diagnosis not present

## 2019-07-30 DIAGNOSIS — Z9049 Acquired absence of other specified parts of digestive tract: Secondary | ICD-10-CM | POA: Diagnosis not present

## 2019-07-30 DIAGNOSIS — Z882 Allergy status to sulfonamides status: Secondary | ICD-10-CM | POA: Diagnosis not present

## 2019-07-30 DIAGNOSIS — Z01818 Encounter for other preprocedural examination: Secondary | ICD-10-CM | POA: Insufficient documentation

## 2019-07-30 DIAGNOSIS — K319 Disease of stomach and duodenum, unspecified: Secondary | ICD-10-CM | POA: Diagnosis present

## 2019-07-30 DIAGNOSIS — I160 Hypertensive urgency: Secondary | ICD-10-CM | POA: Diagnosis not present

## 2019-07-30 DIAGNOSIS — N179 Acute kidney failure, unspecified: Secondary | ICD-10-CM | POA: Diagnosis present

## 2019-07-30 DIAGNOSIS — R519 Headache, unspecified: Secondary | ICD-10-CM | POA: Diagnosis not present

## 2019-07-30 DIAGNOSIS — Z800 Family history of malignant neoplasm of digestive organs: Secondary | ICD-10-CM

## 2019-07-30 DIAGNOSIS — F329 Major depressive disorder, single episode, unspecified: Secondary | ICD-10-CM | POA: Diagnosis present

## 2019-07-30 DIAGNOSIS — R1110 Vomiting, unspecified: Secondary | ICD-10-CM | POA: Insufficient documentation

## 2019-07-30 DIAGNOSIS — Z880 Allergy status to penicillin: Secondary | ICD-10-CM

## 2019-07-30 DIAGNOSIS — F17210 Nicotine dependence, cigarettes, uncomplicated: Secondary | ICD-10-CM | POA: Diagnosis present

## 2019-07-30 DIAGNOSIS — D72829 Elevated white blood cell count, unspecified: Secondary | ICD-10-CM | POA: Insufficient documentation

## 2019-07-30 DIAGNOSIS — Z66 Do not resuscitate: Secondary | ICD-10-CM | POA: Diagnosis not present

## 2019-07-30 DIAGNOSIS — K2960 Other gastritis without bleeding: Secondary | ICD-10-CM | POA: Diagnosis present

## 2019-07-30 DIAGNOSIS — K2970 Gastritis, unspecified, without bleeding: Secondary | ICD-10-CM | POA: Diagnosis not present

## 2019-07-30 DIAGNOSIS — I161 Hypertensive emergency: Secondary | ICD-10-CM | POA: Diagnosis present

## 2019-07-30 DIAGNOSIS — Z807 Family history of other malignant neoplasms of lymphoid, hematopoietic and related tissues: Secondary | ICD-10-CM | POA: Diagnosis not present

## 2019-07-30 DIAGNOSIS — K259 Gastric ulcer, unspecified as acute or chronic, without hemorrhage or perforation: Secondary | ICD-10-CM

## 2019-07-30 DIAGNOSIS — F419 Anxiety disorder, unspecified: Secondary | ICD-10-CM | POA: Diagnosis present

## 2019-07-30 DIAGNOSIS — K3189 Other diseases of stomach and duodenum: Secondary | ICD-10-CM | POA: Diagnosis not present

## 2019-07-30 DIAGNOSIS — Z79899 Other long term (current) drug therapy: Secondary | ICD-10-CM | POA: Diagnosis not present

## 2019-07-30 DIAGNOSIS — K2980 Duodenitis without bleeding: Secondary | ICD-10-CM | POA: Diagnosis not present

## 2019-07-30 DIAGNOSIS — B9681 Helicobacter pylori [H. pylori] as the cause of diseases classified elsewhere: Secondary | ICD-10-CM | POA: Diagnosis not present

## 2019-07-30 DIAGNOSIS — Z791 Long term (current) use of non-steroidal anti-inflammatories (NSAID): Secondary | ICD-10-CM

## 2019-07-30 HISTORY — DX: Essential (primary) hypertension: I10

## 2019-07-30 LAB — COMPREHENSIVE METABOLIC PANEL
ALT: 21 U/L (ref 0–44)
AST: 20 U/L (ref 15–41)
Albumin: 4.9 g/dL (ref 3.5–5.0)
Alkaline Phosphatase: 72 U/L (ref 38–126)
Anion gap: 12 (ref 5–15)
BUN: 20 mg/dL (ref 6–20)
CO2: 24 mmol/L (ref 22–32)
Calcium: 9.5 mg/dL (ref 8.9–10.3)
Chloride: 104 mmol/L (ref 98–111)
Creatinine, Ser: 1.51 mg/dL — ABNORMAL HIGH (ref 0.44–1.00)
GFR calc Af Amer: 47 mL/min — ABNORMAL LOW (ref >60)
GFR calc non Af Amer: 41 mL/min — ABNORMAL LOW (ref >60)
Glucose, Bld: 129 mg/dL — ABNORMAL HIGH (ref 70–99)
Potassium: 4.2 mmol/L (ref 3.5–5.1)
Sodium: 140 mmol/L (ref 135–145)
Total Bilirubin: 0.7 mg/dL (ref 0.3–1.2)
Total Protein: 8.2 g/dL — ABNORMAL HIGH (ref 6.5–8.1)

## 2019-07-30 LAB — CBC WITH DIFFERENTIAL/PLATELET
Abs Immature Granulocytes: 0.14 10*3/uL — ABNORMAL HIGH (ref 0.00–0.07)
Basophils Absolute: 0.1 10*3/uL (ref 0.0–0.1)
Basophils Relative: 0 %
Eosinophils Absolute: 0.1 10*3/uL (ref 0.0–0.5)
Eosinophils Relative: 0 %
HCT: 45.5 % (ref 36.0–46.0)
Hemoglobin: 14.8 g/dL (ref 12.0–15.0)
Immature Granulocytes: 1 %
Lymphocytes Relative: 15 %
Lymphs Abs: 2.7 10*3/uL (ref 0.7–4.0)
MCH: 31.5 pg (ref 26.0–34.0)
MCHC: 32.5 g/dL (ref 30.0–36.0)
MCV: 96.8 fL (ref 80.0–100.0)
Monocytes Absolute: 1.5 10*3/uL — ABNORMAL HIGH (ref 0.1–1.0)
Monocytes Relative: 9 %
Neutro Abs: 13.2 10*3/uL — ABNORMAL HIGH (ref 1.7–7.7)
Neutrophils Relative %: 75 %
Platelets: 380 10*3/uL (ref 150–400)
RBC: 4.7 MIL/uL (ref 3.87–5.11)
RDW: 15.6 % — ABNORMAL HIGH (ref 11.5–15.5)
WBC: 17.7 10*3/uL — ABNORMAL HIGH (ref 4.0–10.5)
nRBC: 0 % (ref 0.0–0.2)

## 2019-07-30 LAB — URINALYSIS, ROUTINE W REFLEX MICROSCOPIC
Bilirubin Urine: NEGATIVE
Glucose, UA: NEGATIVE mg/dL
Hgb urine dipstick: NEGATIVE
Ketones, ur: 5 mg/dL — AB
Nitrite: NEGATIVE
Protein, ur: >=300 mg/dL — AB
Specific Gravity, Urine: 1.019 (ref 1.005–1.030)
pH: 5 (ref 5.0–8.0)

## 2019-07-30 LAB — TROPONIN I (HIGH SENSITIVITY)
Troponin I (High Sensitivity): 6 ng/L (ref <18)
Troponin I (High Sensitivity): 5 ng/L (ref <18)

## 2019-07-30 LAB — CORTISOL: Cortisol, Plasma: 13.3 ug/dL

## 2019-07-30 LAB — RAPID URINE DRUG SCREEN, HOSP PERFORMED
Amphetamines: NOT DETECTED
Barbiturates: NOT DETECTED
Benzodiazepines: POSITIVE — AB
Cocaine: NOT DETECTED
Opiates: NOT DETECTED
Tetrahydrocannabinol: NOT DETECTED

## 2019-07-30 LAB — LIPASE, BLOOD: Lipase: 28 U/L (ref 11–51)

## 2019-07-30 LAB — PROTIME-INR
INR: 0.9 (ref 0.8–1.2)
Prothrombin Time: 12.1 seconds (ref 11.4–15.2)

## 2019-07-30 LAB — RESPIRATORY PANEL BY RT PCR (FLU A&B, COVID)
Influenza A by PCR: NEGATIVE
Influenza B by PCR: NEGATIVE
SARS Coronavirus 2 by RT PCR: NEGATIVE

## 2019-07-30 LAB — MAGNESIUM: Magnesium: 2.2 mg/dL (ref 1.7–2.4)

## 2019-07-30 LAB — TSH: TSH: 0.417 u[IU]/mL (ref 0.350–4.500)

## 2019-07-30 LAB — PHOSPHORUS: Phosphorus: 3.3 mg/dL (ref 2.5–4.6)

## 2019-07-30 MED ORDER — PANTOPRAZOLE SODIUM 40 MG PO TBEC
40.0000 mg | DELAYED_RELEASE_TABLET | Freq: Two times a day (BID) | ORAL | Status: DC
  Administered 2019-07-30 – 2019-08-02 (×6): 40 mg via ORAL
  Filled 2019-07-30 (×6): qty 1

## 2019-07-30 MED ORDER — SODIUM CHLORIDE 0.9 % IV SOLN: Freq: Once | INTRAVENOUS | Status: AC

## 2019-07-30 MED ORDER — METOPROLOL TARTRATE 5 MG/5ML IV SOLN
5.0000 mg | Freq: Once | INTRAVENOUS | Status: AC
  Administered 2019-07-30: 5 mg via INTRAVENOUS
  Filled 2019-07-30: qty 5

## 2019-07-30 MED ORDER — SODIUM CHLORIDE 0.9 % IV BOLUS
500.0000 mL | Freq: Once | INTRAVENOUS | Status: AC
  Administered 2019-07-30: 500 mL via INTRAVENOUS

## 2019-07-30 MED ORDER — AMLODIPINE BESYLATE 10 MG PO TABS
10.0000 mg | ORAL_TABLET | Freq: Every day | ORAL | Status: DC
  Administered 2019-07-31 – 2019-08-02 (×3): 10 mg via ORAL
  Filled 2019-07-30 (×3): qty 1

## 2019-07-30 MED ORDER — ONDANSETRON HCL 4 MG/2ML IJ SOLN: 4.0000 mg | Freq: Four times a day (QID) | INTRAMUSCULAR | Status: DC | PRN

## 2019-07-30 MED ORDER — ACETAMINOPHEN 325 MG PO TABS: 650.0000 mg | ORAL_TABLET | Freq: Four times a day (QID) | ORAL | Status: DC | PRN

## 2019-07-30 MED ORDER — HYDROXYZINE HCL 25 MG PO TABS
100.0000 mg | ORAL_TABLET | Freq: Every day | ORAL | Status: DC
  Administered 2019-07-30 – 2019-08-01 (×3): 100 mg via ORAL
  Filled 2019-07-30 (×3): qty 4

## 2019-07-30 MED ORDER — PAROXETINE HCL 20 MG PO TABS
20.0000 mg | ORAL_TABLET | Freq: Every day | ORAL | Status: DC
  Administered 2019-07-30 – 2019-08-01 (×3): 20 mg via ORAL
  Filled 2019-07-30 (×3): qty 1

## 2019-07-30 MED ORDER — DIPHENHYDRAMINE HCL 50 MG/ML IJ SOLN
25.0000 mg | Freq: Once | INTRAMUSCULAR | Status: AC
  Administered 2019-07-30: 25 mg via INTRAVENOUS
  Filled 2019-07-30: qty 1

## 2019-07-30 MED ORDER — MORPHINE SULFATE (PF) 2 MG/ML IV SOLN
2.0000 mg | INTRAVENOUS | Status: DC | PRN
  Administered 2019-07-30: 2 mg via INTRAVENOUS
  Administered 2019-07-31 (×5): 4 mg via INTRAVENOUS
  Administered 2019-08-01 (×3): 2 mg via INTRAVENOUS
  Filled 2019-07-30: qty 1
  Filled 2019-07-30: qty 2
  Filled 2019-07-30: qty 1
  Filled 2019-07-30: qty 2
  Filled 2019-07-30: qty 1
  Filled 2019-07-30: qty 2
  Filled 2019-07-30: qty 1
  Filled 2019-07-30 (×2): qty 2
  Filled 2019-07-30: qty 1

## 2019-07-30 MED ORDER — CLONIDINE HCL 0.2 MG/24HR TD PTWK
0.2000 mg | MEDICATED_PATCH | TRANSDERMAL | Status: DC
  Administered 2019-07-30: 0.2 mg via TRANSDERMAL
  Filled 2019-07-30: qty 1

## 2019-07-30 MED ORDER — PANTOPRAZOLE SODIUM 40 MG IV SOLR
40.0000 mg | Freq: Once | INTRAVENOUS | Status: AC
  Administered 2019-07-30: 40 mg via INTRAVENOUS
  Filled 2019-07-30: qty 40

## 2019-07-30 MED ORDER — HYDROCODONE-ACETAMINOPHEN 5-325 MG PO TABS
1.0000 | ORAL_TABLET | Freq: Four times a day (QID) | ORAL | Status: DC | PRN
  Administered 2019-07-30 – 2019-08-02 (×5): 2 via ORAL
  Filled 2019-07-30 (×5): qty 2

## 2019-07-30 MED ORDER — MIRTAZAPINE 15 MG PO TABS
30.0000 mg | ORAL_TABLET | Freq: Every day | ORAL | Status: DC
  Administered 2019-07-30 – 2019-08-01 (×3): 30 mg via ORAL
  Filled 2019-07-30 (×3): qty 2

## 2019-07-30 MED ORDER — METOPROLOL SUCCINATE ER 50 MG PO TB24
50.0000 mg | ORAL_TABLET | Freq: Every day | ORAL | Status: DC
  Administered 2019-07-30 – 2019-08-02 (×4): 50 mg via ORAL
  Filled 2019-07-30 (×4): qty 1

## 2019-07-30 MED ORDER — METOCLOPRAMIDE HCL 5 MG/ML IJ SOLN
10.0000 mg | Freq: Once | INTRAMUSCULAR | Status: AC
  Administered 2019-07-30: 10 mg via INTRAVENOUS
  Filled 2019-07-30: qty 2

## 2019-07-30 MED ORDER — ACETAMINOPHEN 650 MG RE SUPP: 650.0000 mg | Freq: Four times a day (QID) | RECTAL | Status: DC | PRN

## 2019-07-30 MED ORDER — POLYETHYLENE GLYCOL 3350 17 G PO PACK: 17.0000 g | PACK | Freq: Every day | ORAL | Status: DC | PRN

## 2019-07-30 MED ORDER — SODIUM CHLORIDE 0.9 % IV SOLN: INTRAVENOUS | Status: DC

## 2019-07-30 MED ORDER — PROMETHAZINE HCL 25 MG/ML IJ SOLN: 12.5000 mg | Freq: Four times a day (QID) | INTRAMUSCULAR | Status: DC | PRN

## 2019-07-30 NOTE — ED Triage Notes (Signed)
Patient states she was admitted to [facility] approx 2 weeks ago for 6 days d/t vomiting. States she could not keep anything down and BP was high. Patient states dx was unknown.  Patient returning d/t high BP readings at home, states highest BP was 233/106.   Patient states she has continuously vomited since leaving hospital, approx 3-4 times a day. Hx of appendectomy

## 2019-07-30 NOTE — Progress Notes (Signed)
Reviewed and agree with management plans. ? ?Montford Barg L. Kathleen Tamm, MD, MPH  ?

## 2019-07-30 NOTE — ED Notes (Signed)
Patient unable to provide urine sample at this time

## 2019-07-30 NOTE — H&P (Signed)
History and Physical    Alicia Krueger [id number] DOB: 1970/12/03 DOA: 07/30/2019  PCP: Patient, No Pcp Per Patient coming from: Home  Chief Complaint: Nausea/vomiting  HPI: Alicia Krueger is a 49 y.o. female with medical history significant of depression, hypertension. Patient presented secondary to continued nausea/vomiting, headache and high blood pressure with an outpatient measured BP of 230/181. She was recently admitted to [facility] two weeks ago for a similar presentation. She states she received a CT scan but she is unsure of what type of CT scan; she states that her symptoms could be secondary to gastroparesis and was prescribed a medication but is unsure of the name. She tried to take Imitrex to help with her headache but threw it up. She reports taking Zofran and Phenergan which have helped (Phenergan IV more than Zofran). She reports seeing  GI last week and is planned for an EGD on 2/26.  ED Course: Vitals: T-max of 98.3 F, pulse of 128, respirations of 18, blood pressure of 143/106 improved to 140/96 Labs: Creatinine of 1.51, WBC of 17,700 Imaging: None Medications/Course: Metoprolol IV injection x2, Reglan IV, Benadryl IV, clonidine patch, Protonix IV  Review of Systems: Review of Systems  Constitutional: Negative for chills and fever.  HENT: Positive for sore throat.   Respiratory: Negative for cough, shortness of breath and wheezing.   Cardiovascular: Negative for chest pain.  Gastrointestinal: Positive for abdominal pain, nausea and vomiting.  Neurological: Positive for tremors (hand shaking) and headaches. Negative for sensory change, focal weakness, loss of consciousness and weakness.  All other systems reviewed and are negative.   Past Medical History:  Diagnosis Date  . Depression   . Essential hypertension     Past Surgical History:  Procedure Laterality Date  . ABDOMINAL HYSTERECTOMY    . APPENDECTOMY    .  CHOLECYSTECTOMY       reports that she has been smoking. She has been smoking about 0.50 packs per day. Her smokeless tobacco use includes snuff. She reports previous alcohol use. She reports previous drug use.  Allergies  Allergen Reactions  . Penicillins Anaphylaxis and Swelling    Did it involve swelling of the face/tongue/throat, SOB, or low BP? yes Did it involve sudden or severe rash/hives, skin peeling, or any reaction on the inside of your mouth or nose? no Did you need to seek medical attention at a hospital or doctor's office? yes When did it last happen?20 years If all above answers are "NO", may proceed with cephalosporin use.  . Sulfa Antibiotics Anaphylaxis and Swelling    Family History  Problem Relation Age of Onset  . Stomach cancer Father   . Pancreatic cancer Maternal Grandmother   . Pancreatic cancer Other        Aunt and uncle    Prior to Admission medications   Medication Sig Start Date End Date Taking? Authorizing Provider  amLODipine (NORVASC) 10 MG tablet Take 10 mg by mouth daily. 07/13/19  Yes [provider]  cloNIDine (CATAPRES) 0.2 MG tablet Take 0.2-0.4 mg by mouth at bedtime. 07/13/19  Yes [provider]  hydrOXYzine (ATARAX/VISTARIL) 50 MG tablet Take 100 mg by mouth at bedtime. 07/04/19  Yes [provider]  ibuprofen (ADVIL) 200 MG tablet Take 800 mg by mouth every 6 (six) hours as needed for mild pain or moderate pain.   Yes [provider]  metoprolol succinate (TOPROL-XL) 50 MG 24 hr tablet Take 50 mg by mouth at bedtime.  07/13/19  Yes [provider]  mirtazapine (REMERON) 30 MG tablet Take 30 mg by mouth at bedtime. 07/04/19  Yes [provider]  omeprazole (PRILOSEC) 40 MG capsule Take 80 mg by mouth 2 (two) times daily. 07/23/19  Yes [provider]  PARoxetine (PAXIL) 20 MG tablet Take 20 mg by mouth at bedtime.  07/04/19  Yes [provider]  SUMAtriptan (IMITREX) 100 MG  tablet Take 100 mg by mouth every 2 (two) hours as needed for migraine.  06/11/19  Yes [provider]  promethazine (PHENERGAN) 25 MG tablet Take 25 mg by mouth every 8 (eight) hours as needed. 07/13/19   [provider]  PROMETHEGAN 25 MG suppository Place 1 suppository rectally daily as needed. 07/23/19   [provider]    Physical Exam:  Physical Exam Constitutional:      General: She is not in acute distress.    Appearance: She is well-developed. She is not diaphoretic.  Eyes:     Conjunctiva/sclera: Conjunctivae normal.     Pupils: Pupils are equal, round, and reactive to light.  Cardiovascular:     Rate and Rhythm: Normal rate and regular rhythm.     Heart sounds: Normal heart sounds. No murmur.  Pulmonary:     Effort: Pulmonary effort is normal. No respiratory distress.     Breath sounds: Normal breath sounds. No wheezing or rales.  Abdominal:     General: Bowel sounds are normal. There is no distension.     Palpations: Abdomen is soft.     Tenderness: There is abdominal tenderness (mild). There is no guarding or rebound.  Musculoskeletal:        General: No tenderness. Normal range of motion.     Cervical back: Normal range of motion.  Lymphadenopathy:     Cervical: No cervical adenopathy.  Skin:    General: Skin is warm and dry.     Comments: Nail depressions noted  Neurological:     Mental Status: She is alert and oriented to person, place, and time.     Labs on Admission: I have personally reviewed following labs and imaging studies  CBC: Recent Labs  Lab 07/30/19 1034  WBC 17.7*  NEUTROABS 13.2*  HGB 14.8  HCT 45.5  MCV 96.8  PLT 123XX123    Basic Metabolic Panel: Recent Labs  Lab 07/30/19 1034  NA 140  K 4.2  CL 104  CO2 24  GLUCOSE 129*  BUN 20  CREATININE 1.51*  CALCIUM 9.5  MG 2.2  PHOS 3.3    GFR: Estimated Creatinine Clearance: 56.9 mL/min (A) (by C-G formula based on SCr of 1.51 mg/dL (H)).  Liver Function  Tests: Recent Labs  Lab 07/30/19 1034  AST 20  ALT 21  ALKPHOS 72  BILITOT 0.7  PROT 8.2*  ALBUMIN 4.9   Recent Labs  Lab 07/30/19 1034  LIPASE 28   No results for input(s): AMMONIA in the last 168 hours.  Coagulation Profile: Recent Labs  Lab 07/30/19 1034  INR 0.9    Cardiac Enzymes: No results for input(s): CKTOTAL, CKMB, CKMBINDEX, TROPONINI in the last 168 hours.  BNP (last 3 results) No results for input(s): PROBNP in the last 8760 hours.  HbA1C: No results for input(s): HGBA1C in the last 72 hours.  CBG: No results for input(s): GLUCAP in the last 168 hours.  Lipid Profile: No results for input(s): CHOL, HDL, LDLCALC, TRIG, CHOLHDL, LDLDIRECT in the last 72 hours.  Thyroid Function Tests: Recent  Labs    07/30/19 1034  TSH 0.417    Anemia Panel: No results for input(s): VITAMINB12, FOLATE, FERRITIN, TIBC, IRON, RETICCTPCT in the last 72 hours.  Urine analysis:    Component Value Date/Time   COLORURINE AMBER (A) 07/30/2019 1017   APPEARANCEUR CLOUDY (A) 07/30/2019 1017   LABSPEC 1.019 07/30/2019 1017   PHURINE 5.0 07/30/2019 1017   GLUCOSEU NEGATIVE 07/30/2019 1017   HGBUR NEGATIVE 07/30/2019 1017   BILIRUBINUR NEGATIVE 07/30/2019 1017   KETONESUR 5 (A) 07/30/2019 1017   PROTEINUR >=300 (A) 07/30/2019 1017   NITRITE NEGATIVE 07/30/2019 1017   LEUKOCYTESUR TRACE (A) 07/30/2019 1017     Radiological Exams on Admission: No results found.  EKG: Independently reviewed. Sinus tachycardia.  Assessment/Plan Active Problems:   Hypertensive emergency  Hypertensive emergency Blood pressure significantly elevated likely secondary to patient's inability to adhere to medication regimen in setting of her nausea and vomiting in addition to rebound from lack of clonidine.  Patient with associated headache likely secondary to persistent hypertension.  Patient also noted to have an elevated creatinine with unknown baseline.  -Restart home  antihypertensives including clonidine, metoprolol, amlodipine  Headache Patient has a history of migraines in her childhood but not recently.  Patient has used Imitrex without improvement of her symptoms.  This is possibly secondary to hypertension.  No associated encephalopathy.  CT scan not obtained in the ED secondary to patient's recent history of multiple CT scans at her recent admission at [facility].  Records have been requested by the EDP. -Norco as needed; morphine for severe pain -Treat hypertension  Nausea/vomiting Recurring issue.  Patient states that she recently saw [place] gastroenterologist and was scheduled for an EGD this Friday at 9:30 AM.  She reports having a history of an EGD 3 years ago in [place] with Dr. Melina Copa and was told she had an ulcer.  Patient states that IV Phenergan has helped with her symptoms the most.  She believes that the nausea and vomiting preceded her headaches. -Phenergan IV and Zofran IV for breakthrough -Protonix -[facility] consulted; NPO after midnight -Abdominal x-ray to rule out obstruction/ileus  Acute kidney injury Unknown creatinine baseline.  Normal BUN.  Possibly secondary to volume depletion from recurrent nausea and vomiting. -IV fluids -BMP in the morning  Tachycardia Possibly secondary to volume depletion from recurrent nausea and vomiting although patient's blood pressure is extremely high.  Patient is also on metoprolol as an outpatient and has not been able to take this medication consistently secondary to nausea vomiting. -Resume metoprolol -Telemetry  DVT prophylaxis: Lovenox Code Status: Full Code Family Communication: None Disposition Plan: Telemetry Consults called: [facility] Admission status: Observation   Cordelia Poche, MD [facility] 07/30/2019, 3:40 PM

## 2019-07-30 NOTE — ED Provider Notes (Addendum)
[place] DEPT Provider Note   CSN: [id number] Arrival date & time: 07/30/19  [id number]     History No chief complaint on file.   Alicia Krueger is a 49 y.o. female.  HPI Patient reports that she has been having a lot of medical problems since 05/28/2019.  She has been having episodes of severe blood pressure elevations.  She was just hospitalized 2 weeks ago at [facility] for 6 days.  Patient reports that she had intractable vomiting and blood pressures as high as 220s over 100s.  She reports that she was discharged with a prescription for metoprolol and she takes nightly clonidine.  She reports however when she started vomiting, she cannot take the metoprolol.  Patient reports that she vomits almost nightly.  She has been having problems with recurrent and persistent upper abdominal pain that she indicates is epigastric and below the ribs on both sides for several months.  She denies problems with diarrhea.  She does not suffer from lower abdominal pain.  She does not feel that specific foods provoke her symptoms although she notes that she can tolerate lean beef better than chicken.  She is scheduled for upper endoscopy in the upcoming week.  She reports she has had CT scans and consultations with gastroenterology and her doctor but no specific diagnosis has been identified.  She feels like this is been a problem ever since she had her gallbladder removed.  Patient reports that she also has problems with headaches.  Headaches are typically frontal in nature.  They come and go.  Patient reports that she does have a history of migraines but that the headaches seem to be more pronounced when her blood pressure is elevated.  She reports that she goes through periods of getting flushed and overheated.  She has had a uterine hysterectomy but still has her ovaries.  Unknown if she is menopausal or not.  Patient reports that both of her ankles swell regardless  if she elevates them or has prolonged standing.  She feels that she gets generalized swelling.  She has not been on any steroids or long-term steroid courses. Patient reports that she was at work today and she became lightheaded and was having a lot of abdominal pain.  Her employer advised her she needed to go to the emergency department for evaluation.  She reports she has not been able to take her morning metoprolol dose because of her symptoms. 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  She does have direct patient contact.  Patient denies any alcohol use.  She denies drugs of abuse.  She denies taking benzodiazepines at home or illicit use of them.  Reports she is on SSRI due to anxiety and depression.  She reports it has been helpful since her father died in May 28, 2023.  She denies any other drug use or possibility of withdrawal.    History reviewed. No pertinent past medical history.  There are no problems to display for this patient.   Past Surgical History:  Procedure Laterality Date  . ABDOMINAL HYSTERECTOMY    . APPENDECTOMY    . CHOLECYSTECTOMY       OB History   No obstetric history on file.     History reviewed. No pertinent family history.  Social History   Tobacco Use  . Smoking status: Current Every Day Smoker    Packs/day: 0.50  . Smokeless tobacco:  Current User    Types: Snuff  Substance Use Topics  . Alcohol use: Not Currently  . Drug use: Not Currently    Home Medications Prior to Admission medications   Medication Sig Start Date End Date Taking? Authorizing Provider  amLODipine (NORVASC) 10 MG tablet Take 10 mg by mouth daily. 07/13/19  Yes [provider]  cloNIDine (CATAPRES) 0.2 MG tablet Take 0.2-0.4 mg by mouth at bedtime. 07/13/19  Yes [provider]  hydrOXYzine (ATARAX/VISTARIL) 50 MG tablet Take 100 mg by mouth at bedtime. 07/04/19  Yes [provider]   ibuprofen (ADVIL) 200 MG tablet Take 800 mg by mouth every 6 (six) hours as needed for mild pain or moderate pain.   Yes [provider]  metoprolol succinate (TOPROL-XL) 50 MG 24 hr tablet Take 50 mg by mouth at bedtime.  07/13/19  Yes [provider]  mirtazapine (REMERON) 30 MG tablet Take 30 mg by mouth at bedtime. 07/04/19  Yes [provider]  omeprazole (PRILOSEC) 40 MG capsule Take 80 mg by mouth 2 (two) times daily. 07/23/19  Yes [provider]  PARoxetine (PAXIL) 20 MG tablet Take 20 mg by mouth at bedtime.  07/04/19  Yes [provider]  SUMAtriptan (IMITREX) 100 MG tablet Take 100 mg by mouth every 2 (two) hours as needed for migraine.  06/11/19  Yes [provider]  promethazine (PHENERGAN) 25 MG tablet Take 25 mg by mouth every 8 (eight) hours as needed. 07/13/19   [provider]  PROMETHEGAN 25 MG suppository Place 1 suppository rectally daily as needed. 07/23/19   [provider]    Allergies    Penicillins and Sulfa antibiotics  Review of Systems   Review of Systems 10 Systems reviewed and are negative for acute change except as noted in the HPI.  Physical Exam Updated Vital Signs BP (!) 173/90   Pulse (!) 126   Temp 98.3 F (36.8 C) (Oral)   Resp (!) 22   Ht 5\' 4"  (1.626 m)   Wt 113.4 kg   LMP  (LMP Unknown)   SpO2 97%   BMI 42.91 kg/m   Physical Exam Constitutional:      Comments: Patient is alert and nontoxic.  Mental status is clear.  She does appear mildly anxious.  No respiratory distress.  Color is good.  HENT:     Head: Normocephalic and atraumatic.     Mouth/Throat:     Mouth: Mucous membranes are moist.     Pharynx: Oropharynx is clear.  Eyes:     General: No scleral icterus.    Extraocular Movements: Extraocular movements intact.     Conjunctiva/sclera: Conjunctivae normal.     Pupils: Pupils are equal, round, and reactive to light.  Neck:     Comments: No  thyromegaly. Cardiovascular:     Comments: Tachycardia.  No rub murmur gallop.  Monitor shows sinus rhythm 115.  Current blood pressure 142/100. Pulmonary:     Effort: Pulmonary effort is normal.     Breath sounds: Normal breath sounds.  Abdominal:     Comments: Abdomen is soft.  Patient endorses epigastric discomfort to palpation.  No guarding.  Lower abdomen nontender.  Musculoskeletal:     Cervical back: Neck supple.     Comments: 1+ focal ankle edema.  Calves are soft and nontender.  No pitting edema of the lower legs.  Skin:    General: Skin is warm and dry.  Neurological:     General:  No focal deficit present.     Mental Status: She is oriented to person, place, and time.     Motor: No weakness.     Coordination: Coordination normal.  Psychiatric:     Comments: Patient is alert and appropriate.  She has good eye contact.  Mildly anxious but normal communication and interaction.     ED Results / Procedures / Treatments   Labs (all labs ordered are listed, but only abnormal results are displayed) Labs Reviewed  COMPREHENSIVE METABOLIC PANEL - Abnormal; Notable for the following components:      Result Value   Glucose, Bld 129 (*)    Creatinine, Ser 1.51 (*)    Total Protein 8.2 (*)    GFR calc non Af Amer 41 (*)    GFR calc Af Amer 47 (*)    All other components within normal limits  CBC WITH DIFFERENTIAL/PLATELET - Abnormal; Notable for the following components:   WBC 17.7 (*)    RDW 15.6 (*)    Neutro Abs 13.2 (*)    Monocytes Absolute 1.5 (*)    Abs Immature Granulocytes 0.14 (*)    All other components within normal limits  URINALYSIS, ROUTINE W REFLEX MICROSCOPIC - Abnormal; Notable for the following components:   Color, Urine AMBER (*)    APPearance CLOUDY (*)    Ketones, ur 5 (*)    Protein, ur >=300 (*)    Leukocytes,Ua TRACE (*)    Bacteria, UA MANY (*)    All other components within normal limits  RAPID URINE DRUG SCREEN, HOSP PERFORMED - Abnormal;  Notable for the following components:   Benzodiazepines POSITIVE (*)    All other components within normal limits  URINE CULTURE  LIPASE, BLOOD  PROTIME-INR  TSH  MAGNESIUM  PHOSPHORUS  CORTISOL  TROPONIN I (HIGH SENSITIVITY)  TROPONIN I (HIGH SENSITIVITY)    EKG EKG Interpretation  Date/Time:  Monday July 30 2019 10:04:16 EST Ventricular Rate:  122 PR Interval:    QRS Duration: 66 QT Interval:  316 QTC Calculation: 451 R Axis:   7 Text Interpretation: Sinus tachycardia no ischemic appearance, otherwise normal, no old comparison Confirmed by Charlesetta Shanks [phone]) on 07/30/2019 10:34:06 AM   Radiology No results found.  Procedures Procedures (including critical care time) CRITICAL CARE Performed by: Charlesetta Shanks   Total critical care time: 60  minutes  Critical care time was exclusive of separately billable procedures and treating other patients.  Critical care was necessary to treat or prevent imminent or life-threatening deterioration.  Critical care was time spent personally by me on the following activities: development of treatment plan with patient and/or surrogate as well as nursing, discussions with consultants, evaluation of patient's response to treatment, examination of patient, obtaining history from patient or surrogate, ordering and performing treatments and interventions, ordering and review of laboratory studies, ordering and review of radiographic studies, pulse oximetry and re-evaluation of patient's condition. Medications Ordered in ED Medications  cloNIDine (CATAPRES - Dosed in mg/24 hr) patch 0.2 mg (0.2 mg Transdermal Patch Applied 07/30/19 1214)  0.9 %  sodium chloride infusion ( Intravenous New Bag/Given 07/30/19 1045)  metoprolol tartrate (LOPRESSOR) injection 5 mg (5 mg Intravenous Given 07/30/19 1050)  metoCLOPramide (REGLAN) injection 10 mg (10 mg Intravenous Given 07/30/19 1123)  diphenhydrAMINE (BENADRYL) injection 25 mg (25 mg  Intravenous Given 07/30/19 1123)  pantoprazole (PROTONIX) injection 40 mg (40 mg Intravenous Given 07/30/19 1122)  sodium chloride 0.9 % bolus 500 mL (500 mLs Intravenous New  Bag/Given 07/30/19 1454)  metoprolol tartrate (LOPRESSOR) injection 5 mg (5 mg Intravenous Given 07/30/19 1454)    ED Course  I have reviewed the triage vital signs and the nursing notes.  Pertinent labs & imaging results that were available during my care of the patient were reviewed by me and considered in my medical decision making (see chart for details).    MDM Rules/Calculators/A&P                      Patient presented with symptoms of vomiting and hypertension.  She had not been able to take her metoprolol and is typically taking nightly clonidine.  Consideration was given to rebound hypertension and tachycardia.  Patient was given metoprolol IV and a clonidine patch placed.  Fluid resuscitation initiated.  Patient was given combination of Reglan, Benadryl and Protonix for GI symptoms.  Although she reports frequent vomiting, there has not been active vomiting in the emergency department.  Patient's mental status is clear.  She is alert and nontoxic.  No neurologic dysfunction or complaints.  Respiratory status is clear.  Patient denies any chest pain, shortness of breath, fevers or myalgias.  At this time, underlying etiology of recurrent symptoms is still under evaluation.  At this time I have not repeated CT scans as patient describes extensive diagnostic work-up at [facility] and with prior consultations to gastroenterology.  Medical records have been requested.  Patient continues to be tachycardic at rest and while she is asleep.  Heart rate still in the 120s.  Patient denies feeling short of breath or chest pain.  Blood pressures had improved but have now rebounded again into 170s over 90s.  Patient again feels nausea and some pressure-like headache.  Will administer another dose of metoprolol and fluids.  As  symptoms are not controlled will plan for admission.  Consideration given to the addition of urinary metanephrines.  TSH is normal.  A spot cortisol was checked and within normal range.  Unclear if patient has underlying endocrinological/autoimmune/or allergic driver for some of the symptoms that she describes.  To date, she denies specific diagnosis however medical records from most recent hospitalization are pending. Final Clinical Impression(s) / ED Diagnoses Final diagnoses:  Hypertensive urgency  Intractable nausea and vomiting  Tachycardia    Rx / DC Orders ED Discharge Orders    None       Charlesetta Shanks, MD 07/30/19 1507    Charlesetta Shanks, MD 07/30/19 [phone]

## 2019-07-30 NOTE — Progress Notes (Signed)
   07/30/19 1736  MEWS Score  Temp 98.1 F (36.7 C)  BP (!) 140/96  Pulse Rate (!) 111  Resp (!) 24  SpO2 95 %  O2 Device Room Air    Dr. Lonny Prude aware

## 2019-07-31 ENCOUNTER — Encounter (HOSPITAL_COMMUNITY)
Admission: EM | Disposition: A | Discharge status: Home / Self Care | Payer: Self-pay | Source: Home / Self Care | Attending: Internal Medicine

## 2019-07-31 ENCOUNTER — Encounter (HOSPITAL_COMMUNITY): Payer: Self-pay | Admitting: Family Medicine

## 2019-07-31 ENCOUNTER — Observation Stay (HOSPITAL_COMMUNITY): Payer: BC Managed Care – PPO | Admitting: Anesthesiology

## 2019-07-31 ENCOUNTER — Encounter (HOSPITAL_COMMUNITY): Payer: Self-pay | Admitting: Emergency Medicine

## 2019-07-31 DIAGNOSIS — K2970 Gastritis, unspecified, without bleeding: Secondary | ICD-10-CM

## 2019-07-31 DIAGNOSIS — K319 Disease of stomach and duodenum, unspecified: Secondary | ICD-10-CM | POA: Diagnosis present

## 2019-07-31 DIAGNOSIS — K269 Duodenal ulcer, unspecified as acute or chronic, without hemorrhage or perforation: Secondary | ICD-10-CM

## 2019-07-31 DIAGNOSIS — K3189 Other diseases of stomach and duodenum: Secondary | ICD-10-CM | POA: Diagnosis not present

## 2019-07-31 DIAGNOSIS — F17210 Nicotine dependence, cigarettes, uncomplicated: Secondary | ICD-10-CM | POA: Diagnosis present

## 2019-07-31 DIAGNOSIS — B9681 Helicobacter pylori [H. pylori] as the cause of diseases classified elsewhere: Secondary | ICD-10-CM | POA: Diagnosis not present

## 2019-07-31 DIAGNOSIS — K253 Acute gastric ulcer without hemorrhage or perforation: Secondary | ICD-10-CM | POA: Diagnosis not present

## 2019-07-31 DIAGNOSIS — Z791 Long term (current) use of non-steroidal anti-inflammatories (NSAID): Secondary | ICD-10-CM | POA: Diagnosis not present

## 2019-07-31 DIAGNOSIS — I160 Hypertensive urgency: Secondary | ICD-10-CM | POA: Diagnosis not present

## 2019-07-31 DIAGNOSIS — R519 Headache, unspecified: Secondary | ICD-10-CM | POA: Diagnosis not present

## 2019-07-31 DIAGNOSIS — Z20822 Contact with and (suspected) exposure to covid-19: Secondary | ICD-10-CM | POA: Diagnosis present

## 2019-07-31 DIAGNOSIS — F329 Major depressive disorder, single episode, unspecified: Secondary | ICD-10-CM | POA: Diagnosis present

## 2019-07-31 DIAGNOSIS — Z66 Do not resuscitate: Secondary | ICD-10-CM | POA: Diagnosis present

## 2019-07-31 DIAGNOSIS — K259 Gastric ulcer, unspecified as acute or chronic, without hemorrhage or perforation: Secondary | ICD-10-CM | POA: Diagnosis present

## 2019-07-31 DIAGNOSIS — R112 Nausea with vomiting, unspecified: Secondary | ICD-10-CM

## 2019-07-31 DIAGNOSIS — Z6841 Body Mass Index (BMI) 40.0 and over, adult: Secondary | ICD-10-CM | POA: Diagnosis not present

## 2019-07-31 DIAGNOSIS — Z880 Allergy status to penicillin: Secondary | ICD-10-CM | POA: Diagnosis not present

## 2019-07-31 DIAGNOSIS — Z800 Family history of malignant neoplasm of digestive organs: Secondary | ICD-10-CM | POA: Diagnosis not present

## 2019-07-31 DIAGNOSIS — R000 Tachycardia, unspecified: Secondary | ICD-10-CM | POA: Diagnosis not present

## 2019-07-31 DIAGNOSIS — K2960 Other gastritis without bleeding: Secondary | ICD-10-CM | POA: Diagnosis present

## 2019-07-31 DIAGNOSIS — Z79899 Other long term (current) drug therapy: Secondary | ICD-10-CM | POA: Diagnosis not present

## 2019-07-31 DIAGNOSIS — Z882 Allergy status to sulfonamides status: Secondary | ICD-10-CM | POA: Diagnosis not present

## 2019-07-31 DIAGNOSIS — K2990 Gastroduodenitis, unspecified, without bleeding: Secondary | ICD-10-CM | POA: Diagnosis not present

## 2019-07-31 DIAGNOSIS — N179 Acute kidney failure, unspecified: Secondary | ICD-10-CM | POA: Diagnosis present

## 2019-07-31 DIAGNOSIS — K2980 Duodenitis without bleeding: Secondary | ICD-10-CM | POA: Diagnosis not present

## 2019-07-31 DIAGNOSIS — Z807 Family history of other malignant neoplasms of lymphoid, hematopoietic and related tissues: Secondary | ICD-10-CM | POA: Diagnosis not present

## 2019-07-31 DIAGNOSIS — F419 Anxiety disorder, unspecified: Secondary | ICD-10-CM | POA: Diagnosis present

## 2019-07-31 DIAGNOSIS — Z9049 Acquired absence of other specified parts of digestive tract: Secondary | ICD-10-CM | POA: Diagnosis not present

## 2019-07-31 DIAGNOSIS — Z90711 Acquired absence of uterus with remaining cervical stump: Secondary | ICD-10-CM | POA: Diagnosis not present

## 2019-07-31 DIAGNOSIS — I161 Hypertensive emergency: Secondary | ICD-10-CM | POA: Diagnosis not present

## 2019-07-31 DIAGNOSIS — I10 Essential (primary) hypertension: Secondary | ICD-10-CM | POA: Diagnosis present

## 2019-07-31 HISTORY — PX: ESOPHAGOGASTRODUODENOSCOPY (EGD) WITH PROPOFOL: SHX5813

## 2019-07-31 HISTORY — PX: BIOPSY: SHX5522

## 2019-07-31 LAB — COMPREHENSIVE METABOLIC PANEL
ALT: 18 U/L (ref 0–44)
AST: 19 U/L (ref 15–41)
Albumin: 3.9 g/dL (ref 3.5–5.0)
Alkaline Phosphatase: 56 U/L (ref 38–126)
Anion gap: 9 (ref 5–15)
BUN: 18 mg/dL (ref 6–20)
CO2: 24 mmol/L (ref 22–32)
Calcium: 8.5 mg/dL — ABNORMAL LOW (ref 8.9–10.3)
Chloride: 108 mmol/L (ref 98–111)
Creatinine, Ser: 0.95 mg/dL (ref 0.44–1.00)
GFR calc Af Amer: >60 mL/min (ref >60)
GFR calc non Af Amer: >60 mL/min (ref >60)
Glucose, Bld: 108 mg/dL — ABNORMAL HIGH (ref 70–99)
Potassium: 4.1 mmol/L (ref 3.5–5.1)
Sodium: 141 mmol/L (ref 135–145)
Total Bilirubin: 0.6 mg/dL (ref 0.3–1.2)
Total Protein: 6.5 g/dL (ref 6.5–8.1)

## 2019-07-31 LAB — CBC
HCT: 41.6 % (ref 36.0–46.0)
Hemoglobin: 13 g/dL (ref 12.0–15.0)
MCH: 31.2 pg (ref 26.0–34.0)
MCHC: 31.3 g/dL (ref 30.0–36.0)
MCV: 99.8 fL (ref 80.0–100.0)
Platelets: 307 10*3/uL (ref 150–400)
RBC: 4.17 MIL/uL (ref 3.87–5.11)
RDW: 15.8 % — ABNORMAL HIGH (ref 11.5–15.5)
WBC: 12.6 10*3/uL — ABNORMAL HIGH (ref 4.0–10.5)
nRBC: 0 % (ref 0.0–0.2)

## 2019-07-31 LAB — SEDIMENTATION RATE: Sed Rate: 4 mm/hr (ref 0–22)

## 2019-07-31 LAB — HIV ANTIBODY (ROUTINE TESTING W REFLEX): HIV Screen 4th Generation wRfx: NONREACTIVE

## 2019-07-31 SURGERY — ESOPHAGOGASTRODUODENOSCOPY (EGD) WITH PROPOFOL
Anesthesia: Monitor Anesthesia Care

## 2019-07-31 MED ORDER — ONDANSETRON HCL 4 MG/2ML IJ SOLN
4.0000 mg | Freq: Four times a day (QID) | INTRAMUSCULAR | Status: DC
  Administered 2019-07-31 – 2019-08-01 (×5): 4 mg via INTRAVENOUS
  Filled 2019-07-31 (×5): qty 2

## 2019-07-31 MED ORDER — ONDANSETRON HCL 4 MG/2ML IJ SOLN
INTRAMUSCULAR | Status: DC | PRN
  Administered 2019-07-31: 4 mg via INTRAVENOUS

## 2019-07-31 MED ORDER — LACTATED RINGERS IV SOLN: INTRAVENOUS | Status: DC | PRN

## 2019-07-31 MED ORDER — PROPOFOL 500 MG/50ML IV EMUL
INTRAVENOUS | Status: DC | PRN
  Administered 2019-07-31: 145 ug/kg/min via INTRAVENOUS
  Administered 2019-07-31: 40 mg via INTRAVENOUS

## 2019-07-31 MED ORDER — LIDOCAINE HCL (CARDIAC) PF 100 MG/5ML IV SOSY
PREFILLED_SYRINGE | INTRAVENOUS | Status: DC | PRN
  Administered 2019-07-31: 100 mg via INTRAVENOUS

## 2019-07-31 MED ORDER — SUCRALFATE 1 GM/10ML PO SUSP
1.0000 g | Freq: Three times a day (TID) | ORAL | Status: DC
  Administered 2019-07-31 – 2019-08-02 (×8): 1 g via ORAL
  Filled 2019-07-31 (×8): qty 10

## 2019-07-31 SURGICAL SUPPLY — 15 items

## 2019-07-31 NOTE — Anesthesia Postprocedure Evaluation (Signed)
Anesthesia Post Note  Patient: Alicia Krueger  Procedure(s) Performed: ESOPHAGOGASTRODUODENOSCOPY (EGD) WITH PROPOFOL (N/A ) BIOPSY     Patient location during evaluation: PACU Anesthesia Type: MAC Level of consciousness: awake and alert Pain management: pain level controlled Vital Signs Assessment: post-procedure vital signs reviewed and stable Respiratory status: spontaneous breathing, nonlabored ventilation and respiratory function stable Cardiovascular status: blood pressure returned to baseline and stable Postop Assessment: no apparent nausea or vomiting Anesthetic complications: no    Last Vitals:  Vitals:   07/31/19 1359 07/31/19 1410  BP: (!) 117/52 (!) 142/91  Pulse: 96 90  Resp: 19 (!) 24  Temp:    SpO2: 100% 96%    Last Pain:  Vitals:   07/31/19 1410  TempSrc:   PainSc: 0-No pain                 Pervis Hocking

## 2019-07-31 NOTE — Op Note (Signed)
[facility] Patient Name: Alicia Krueger Procedure Date: 07/31/2019 MRN: [id number] Attending MD: Gerrit Heck , MD Date of Birth: [birth year] CSN: [id number] Age: 49 Admit Type: Inpatient Procedure:                Upper GI endoscopy Indications:              Nausea with vomiting                           49 yo female with progressive nausea and vomiting                            which started after ccy in 2019, and worsening over                            last several months. Admitted with hypertensive                            emergency as well. No heartburn, regurgitation or                            dysphagia. Providers:                Gerrit Heck, MD, Glori Bickers, RN, William Dalton, Technician Referring MD:              Medicines:                Monitored Anesthesia Care Complications:            No immediate complications. Estimated Blood Loss:     Estimated blood loss was minimal. Procedure:                Pre-Anesthesia Assessment:                           - Prior to the procedure, a History and Physical                            was performed, and patient medications and                            allergies were reviewed. The patient's tolerance of                            previous anesthesia was also reviewed. The risks                            and benefits of the procedure and the sedation                            options and risks were discussed with the patient.                            All questions were answered,  and informed consent                            was obtained. Prior Anticoagulants: The patient has                            taken no previous anticoagulant or antiplatelet                            agents. ASA Grade Assessment: III - A patient with                            severe systemic disease. After reviewing the risks                            and benefits, the patient was deemed  in                            satisfactory condition to undergo the procedure.                           After obtaining informed consent, the endoscope was                            passed under direct vision. Throughout the                            procedure, the patient's blood pressure, pulse, and                            oxygen saturations were monitored continuously. The                            [id number]) Olympus gastroscope was                            introduced through the mouth, and advanced to the                            second part of duodenum. The upper GI endoscopy was                            accomplished without difficulty. The patient                            tolerated the procedure well. Scope In: Scope Out: Findings:      The upper third of the esophagus, middle third of the esophagus and       lower third of the esophagus were normal.      There was subtle inflammatory changes noted at an otherwise normal       appearing GE Junction, located 40 cm from the incisors.      Scattered mild inflammation characterized by erythema was found in the       gastric body, at the incisura and in  the gastric antrum. There were a       few small, non-bleeding erosions noted in the antrum. Biopsies were       taken with a cold forceps for Helicobacter pylori testing. Estimated       blood loss was minimal.      Few non-bleeding linear duodenal ulcers with no stigmata of bleeding       were found in the first portion of the duodenum. The largest lesion was       4 mm in largest dimension. There was duodenitis in the duodenal bulb,       characterized by erythema and edema (mild). Biopsies were taken with a       cold forceps for histology. Estimated blood loss was minimal.      The second portion of the duodenum was normal. Impression:               - Normal upper third of esophagus, middle third of                            esophagus and lower third of  esophagus.                           - Z-line regular, 40 cm from the incisors.                           - Gastritis. Biopsied.                           - Non-bleeding duodenal ulcers with no stigmata of                            bleeding. Biopsied.                           - Normal second portion of the duodenum. Moderate Sedation:      Not Applicable - Patient had care per Anesthesia. Recommendation:           - Return patient to hospital ward for ongoing care.                           - Full liquid diet.                           - Continue present medications.                           - Await pathology results.                           - Use Protonix (pantoprazole) 40 mg PO BID for 6                            weeks.                           - Use sucralfate suspension 1 gram PO QID for 4  weeks. Procedure Code(s):        --- Professional ---                           [phone], Esophagogastroduodenoscopy, flexible,                            transoral; with biopsy, single or multiple Diagnosis Code(s):        --- Professional ---                           K29.70, Gastritis, unspecified, without bleeding                           K26.9, Duodenal ulcer, unspecified as acute or                            chronic, without hemorrhage or perforation                           R11.2, Nausea with vomiting, unspecified CPT copyright 2019 American [employer]. All rights reserved. The codes documented in this report are preliminary and upon coder review may  be revised to meet current compliance requirements. Gerrit Heck, MD 07/31/2019 2:10:45 PM Number of Addenda: 0

## 2019-07-31 NOTE — Interval H&P Note (Signed)
History and Physical Interval Note:  07/31/2019 1:28 PM  Alicia Krueger [name]  has presented today for surgery, with the diagnosis of nausea/vomiting.  The various methods of treatment have been discussed with the patient and family. After consideration of risks, benefits and other options for treatment, the patient has consented to  Procedure(s): ESOPHAGOGASTRODUODENOSCOPY (EGD) WITH PROPOFOL (N/A) as a surgical intervention.  The patient's history has been reviewed, patient examined, no change in status, stable for surgery.  I have reviewed the patient's chart and labs.  Questions were answered to the patient's satisfaction.     Dominic Pea Rasool Rommel

## 2019-07-31 NOTE — Progress Notes (Signed)
PROGRESS NOTE    Alicia Krueger  [id number] DOB: July 02, 1970 DOA: 07/30/2019 PCP: Patient, No Pcp Per     Brief Narrative:  49 year old woman admitted from home on 2/22 due to uncontrolled nausea and vomiting, headache and high blood pressures.  She was found to have a blood pressure upon presentation of 230/181.  2 weeks ago she was admitted to [facility] for the same symptoms.  No EGD done at that time.  There was mention that she might have gastroparesis but she did not have a gastric emptying scan.  She did see [facility] as an outpatient last week and had been planned for an outpatient EGD.  Admission was requested for further evaluation and management.   Assessment & Plan:   Active Problems:   Hypertensive emergency   Intractable nausea and vomiting   Gastritis and gastroduodenitis   Duodenal ulcer   Gastric erosion   Hypertensive emergency  -Improved now that she is back on her medications. -Suspect this was simply due to patient's inability to adhere to medication regimen in setting of intractable nausea and vomiting, possibly some rebound hypertensive effect from clonidine. -Do not believe we need to pursue pheochromocytoma work-up at this time.  Headache -Likely due to elevated blood pressures, improved.  Intractable nausea and vomiting -This is a recurrent issue. -GI has been consulted, they will do endoscopy as an inpatient later today. -Continue IV antiemetics, IV PPI therapy.  She remains n.p.o. in anticipation of her EGD.  Acute renal failure -Creatinine on admission was 1.51 and is down to 0.95 today with IV fluids. -Suspect secondary to intravascular volume depletion from intractable nausea and vomiting. -Continue IV fluids today.   DVT prophylaxis: SCDs Code Status: DNR Family Communication: Patient only Disposition Plan: Home and likely 48 hours pending symptomatic improvement in EGD results  Consultants:   GI  Procedures:    EGD planned for 2/23  Antimicrobials:  Anti-infectives (From admission, onward)   None       Subjective: Headache is improved, still nauseous, no vomiting since admission  Objective: Vitals:   07/31/19 1227 07/31/19 1243 07/31/19 1245 07/31/19 1359  BP: 139/81  (!) 143/72 (!) 117/52  Pulse: 92 98  96  Resp: 13 17  19   Temp: 97.7 F (36.5 C) 98.6 F (37 C)    TempSrc: Oral Oral    SpO2: 96% 96%  100%  Weight:  115.2 kg    Height:  5\' 4"  (1.626 m)      Intake/Output Summary (Last 24 hours) at 07/31/2019 1415 Last data filed at 07/31/2019 0600 Gross per 24 hour  Intake 1619.5 ml  Output 0 ml  Net 1619.5 ml   Filed Weights   07/30/19 0950 07/31/19 1243  Weight: 113.4 kg 115.2 kg    Examination:  General exam: Alert, awake, oriented x 3 Respiratory system: Clear to auscultation. Respiratory effort normal. Cardiovascular system:RRR. No murmurs, rubs, gallops. Gastrointestinal system: Abdomen is nondistended, soft and nontender. No organomegaly or masses felt. Normal bowel sounds heard. Central nervous system: Alert and oriented. No focal neurological deficits. Extremities: No C/C/E, +pedal pulses Skin: No rashes, lesions or ulcers Psychiatry: Judgement and insight appear normal. Mood & affect appropriate.     Data Reviewed: I have personally reviewed following labs and imaging studies  CBC: Recent Labs  Lab 07/30/19 1034 07/31/19 0452  WBC 17.7* 12.6*  NEUTROABS 13.2*  --   HGB 14.8 13.0  HCT 45.5 41.6  MCV 96.8 99.8  PLT  380 [id number]   Basic Metabolic Panel: Recent Labs  Lab 07/30/19 1034 07/31/19 0452  NA 140 141  K 4.2 4.1  CL 104 108  CO2 24 24  GLUCOSE 129* 108*  BUN 20 18  CREATININE 1.51* 0.95  CALCIUM 9.5 8.5*  MG 2.2  --   PHOS 3.3  --    GFR: Estimated Creatinine Clearance: 91.2 mL/min (by C-G formula based on SCr of 0.95 mg/dL). Liver Function Tests: Recent Labs  Lab 07/30/19 1034 07/31/19 0452  AST 20 19  ALT 21 18  ALKPHOS  72 56  BILITOT 0.7 0.6  PROT 8.2* 6.5  ALBUMIN 4.9 3.9   Recent Labs  Lab 07/30/19 1034  LIPASE 28   No results for input(s): AMMONIA in the last 168 hours. Coagulation Profile: Recent Labs  Lab 07/30/19 1034  INR 0.9   Cardiac Enzymes: No results for input(s): CKTOTAL, CKMB, CKMBINDEX, TROPONINI in the last 168 hours. BNP (last 3 results) No results for input(s): PROBNP in the last 8760 hours. HbA1C: No results for input(s): HGBA1C in the last 72 hours. CBG: No results for input(s): GLUCAP in the last 168 hours. Lipid Profile: No results for input(s): CHOL, HDL, LDLCALC, TRIG, CHOLHDL, LDLDIRECT in the last 72 hours. Thyroid Function Tests: Recent Labs    07/30/19 1034  TSH 0.417   Anemia Panel: No results for input(s): VITAMINB12, FOLATE, FERRITIN, TIBC, IRON, RETICCTPCT in the last 72 hours. Urine analysis:    Component Value Date/Time   COLORURINE AMBER (A) 07/30/2019 1017   APPEARANCEUR CLOUDY (A) 07/30/2019 1017   LABSPEC 1.019 07/30/2019 1017   PHURINE 5.0 07/30/2019 1017   GLUCOSEU NEGATIVE 07/30/2019 1017   HGBUR NEGATIVE 07/30/2019 1017   BILIRUBINUR NEGATIVE 07/30/2019 1017   KETONESUR 5 (A) 07/30/2019 1017   PROTEINUR >=300 (A) 07/30/2019 1017   NITRITE NEGATIVE 07/30/2019 1017   LEUKOCYTESUR TRACE (A) 07/30/2019 1017   Sepsis Labs: @LABRCNTIP (procalcitonin:4,lacticidven:4)  ) Recent Results (from the past 240 hour(s))  Respiratory Panel by RT PCR (Flu A&B, Covid) - Nasopharyngeal Swab     Status: None   Collection Time: 07/30/19  3:12 PM   Specimen: Nasopharyngeal Swab  Result Value Ref Range Status   SARS Coronavirus 2 by RT PCR NEGATIVE NEGATIVE Final    Comment: (NOTE) SARS-CoV-2 target nucleic acids are NOT DETECTED. The SARS-CoV-2 RNA is generally detectable in upper respiratoy specimens during the acute phase of infection. The lowest concentration of SARS-CoV-2 viral copies this assay can detect is 131 copies/mL. A negative result  does not preclude SARS-Cov-2 infection and should not be used as the sole basis for treatment or other patient management decisions. A negative result may occur with  improper specimen collection/handling, submission of specimen other than nasopharyngeal swab, presence of viral mutation(s) within the areas targeted by this assay, and inadequate number of viral copies (<131 copies/mL). A negative result must be combined with clinical observations, patient history, and epidemiological information. The expected result is Negative. Fact Sheet for Patients:  PinkCheek.be Fact Sheet for Healthcare Providers:  GravelBags.it This test is not yet ap proved or cleared by the [place] FDA and  has been authorized for detection and/or diagnosis of SARS-CoV-2 by FDA under an Emergency Use Authorization (EUA). This EUA will remain  in effect (meaning this test can be used) for the duration of the COVID-19 declaration under Section 564(b)(1) of the Act, 21 U.S.C. section 360bbb-3(b)(1), unless the authorization is terminated or revoked sooner.    Influenza A  by PCR NEGATIVE NEGATIVE Final   Influenza B by PCR NEGATIVE NEGATIVE Final    Comment: (NOTE) The Xpert Xpress SARS-CoV-2/FLU/RSV assay is intended as an aid in  the diagnosis of influenza from Nasopharyngeal swab specimens and  should not be used as a sole basis for treatment. Nasal washings and  aspirates are unacceptable for Xpert Xpress SARS-CoV-2/FLU/RSV  testing. Fact Sheet for Patients: PinkCheek.be Fact Sheet for Healthcare Providers: GravelBags.it This test is not yet approved or cleared by the [place] FDA and  has been authorized for detection and/or diagnosis of SARS-CoV-2 by  FDA under an Emergency Use Authorization (EUA). This EUA will remain  in effect (meaning this test can be used) for the duration of  the  Covid-19 declaration under Section 564(b)(1) of the Act, 21  U.S.C. section 360bbb-3(b)(1), unless the authorization is  terminated or revoked. Performed at [facility], [place] [address] [place], [place] [address]          Radiology Studies: DG Abd Portable 1V  Result Date: 07/30/2019 CLINICAL DATA:  49 year old female with nausea vomiting. EXAM: PORTABLE ABDOMEN - 1 VIEW COMPARISON:  None. FINDINGS: Top-normal bowel loops measuring up to 2.7 cm in the left hemiabdomen. No bowel dilatation or evidence of obstruction. No free air. Right upper quadrant cholecystectomy clips. No acute osseous pathology. IMPRESSION: No bowel dilatation or evidence of obstruction. Electronically Signed   By: Anner Crete M.D.   On: 07/30/2019 19:13        Scheduled Meds: . [MAR Hold] amLODipine  10 mg Oral Daily  . [MAR Hold] cloNIDine  0.2 mg Transdermal Weekly  . [MAR Hold] hydrOXYzine  100 mg Oral QHS  . [MAR Hold] metoprolol succinate  50 mg Oral Daily  . [MAR Hold] mirtazapine  30 mg Oral QHS  . [MAR Hold] ondansetron (ZOFRAN) IV  4 mg Intravenous Q6H  . [MAR Hold] pantoprazole  40 mg Oral BID  . [MAR Hold] PARoxetine  20 mg Oral QHS   Continuous Infusions: . sodium chloride Stopped (07/31/19 1235)     LOS: 0 days    Time spent: 25 minutes. Greater than 50% of this time was spent in direct contact with the patient, coordinating care and discussing relevant ongoing clinical issues, including.     Lelon Frohlich, MD [facility] Pager [phone]  If 7PM-7AM, please contact night-coverage [email or web address] Password [email or web address] 07/31/2019, 2:15 PM

## 2019-07-31 NOTE — Consult Note (Signed)
Consultation  Referring Provider: [facility]/ Nettey MD Primary Care Physician:  Patient, No Pcp Per Primary Gastroenterologist: Dr. Tarri Glenn  Reason for Consultation: Persistent nausea and vomiting  HPI: Alicia Krueger is a 49 y.o. female, who we are asked to evaluate regarding persistent nausea and vomiting.  Patient was admitted through the emergency room yesterday afternoon with inability to keep down p.o.'s over this past weekend and multiple episodes of nausea and vomiting.  She says she been unable to keep her meds down and also had a hypertensive emergency with blood pressure of 230/181 recorded as an outpatient. When records were reviewed last evening was no indication that she had been seen in our office, not sure if charts have been merged however her recent office visit is now visible.  She was seen by Alonza Bogus on 07/23/2019 and scheduled for EGD with Dr. Tarri Glenn later this week.  Patient is status post cholecystectomy appendectomy and hysterectomy.  She had her gallbladder out about 2 years ago at [facility].  She did have gallstones.  She says ever since her gallbladder was removed she has been having problems with recurrent nausea and vomiting.  She was seen by Dr. Butler/gastroenterology in [place] over a year ago and underwent EGD by her report.  She says she was told she may have some ulcers.  Around that same time she lost her job and insurance and did not have any further work-up for a while.  Around November she started having increased difficulty with repeated episodes of nausea and vomiting often intractable and vomiting for hours at a time.  Interestingly around the same time her father had passed away, and she had had an incredibly stressful year by her to that time.  She had been on Remeron prior to that time and Paxil was added to her regimen. She had been seen in the emergency room at [facility] apparently in November with 1 of these episodes, had CT imaging  that she says was negative, was treated with antiemetics and PPI and sent home.  She does not feel that Zofran even ODT helps her symptoms much. She relates that she has been having intermittent nausea and vomiting ever since almost on a daily basis.  Vomiting will be random and not necessarily postprandially.  She is able to keep some meals down.  She describes a lot of heartburn and indigestion as well.  She says she will have abdominal pain after multiple episodes of vomiting which she says is usually hard retching. Interestingly she has not had any weight loss but feels this is because she is retaining fluid. About 2 weeks ago she was admitted to [facility] and had a 4-day stay.  She did not have GI consultation She was told by the hospitalist that she may have gastroparesis, no gastric emptying scan was done.  She was advised to be seen by GI on discharge.  Apparently she did have another CT scan at the time of that admission which she was told was negative. She has had persistent symptoms ever since and had a horrible weekend this past weekend.  She is also been noticing that her blood pressures have been extremely high especially when she cannot keep her meds down.  This is been associated with some sensation of shakiness, flushing and headaches. She is feeling better this morning, blood pressure under much better control with IV meds and is not vomiting. Denies any regular EtOH, no regular NSAIDs, no cannabis. Had been  given a prescription for metoclopramide to take once daily but did not feel that was helping. She says some days she can tolerate red meat okay, chicken seems to be bothering her recently with increased vomiting.   Past Medical History:  Diagnosis Date  . Depression   . Essential hypertension   . Insomnia     Past Surgical History:  Procedure Laterality Date  . ABDOMINAL HYSTERECTOMY    . APPENDECTOMY    . CHOLECYSTECTOMY    . DILATION AND CURETTAGE OF UTERUS      x 4  . PARTIAL HYSTERECTOMY      Prior to Admission medications   Medication Sig Start Date End Date Taking? Authorizing Provider  amLODipine (NORVASC) 10 MG tablet Take 10 mg by mouth daily. 07/13/19  Yes [provider]  cloNIDine (CATAPRES) 0.2 MG tablet Take 0.2-0.4 mg by mouth at bedtime. 07/13/19  Yes [provider]  hydrOXYzine (ATARAX/VISTARIL) 50 MG tablet Take 100 mg by mouth at bedtime. 07/04/19  Yes [provider]  ibuprofen (ADVIL) 200 MG tablet Take 800 mg by mouth every 6 (six) hours as needed for mild pain or moderate pain.   Yes [provider]  metoprolol succinate (TOPROL-XL) 50 MG 24 hr tablet Take 50 mg by mouth at bedtime.  07/13/19  Yes [provider]  mirtazapine (REMERON) 30 MG tablet Take 30 mg by mouth at bedtime. 07/04/19  Yes [provider]  omeprazole (PRILOSEC) 40 MG capsule Take 80 mg by mouth 2 (two) times daily. 07/23/19  Yes [provider]  PARoxetine (PAXIL) 20 MG tablet Take 20 mg by mouth at bedtime.  07/04/19  Yes [provider]  SUMAtriptan (IMITREX) 100 MG tablet Take 100 mg by mouth every 2 (two) hours as needed for migraine.  06/11/19  Yes [provider]  amLODipine (NORVASC) 10 MG tablet Take 10 mg by mouth daily.    [provider]  clonazePAM (KLONOPIN) 1 MG tablet Take 1 mg by mouth at bedtime.    [provider]  cloNIDine (CATAPRES) 0.2 MG tablet Take 0.2 mg by mouth at bedtime as needed.    [provider]  hydrochlorothiazide (MICROZIDE) 12.5 MG capsule Take 12.5 mg by mouth daily.    [provider]  hydrOXYzine (ATARAX/VISTARIL) 50 MG tablet Take 50 mg by mouth at bedtime as needed.    [provider]  metoCLOPramide (REGLAN) 10 MG tablet Take 10 mg by mouth daily.    [provider]  metoprolol succinate (TOPROL-XL) 50 MG 24 hr tablet Take 50 mg by mouth daily. Take with or immediately following a meal.     [provider]  mirtazapine (REMERON) 30 MG tablet Take 30 mg by mouth at bedtime.    [provider]  omeprazole (PRILOSEC) 40 MG capsule Take 1 capsule (40 mg total) by mouth in the morning and at bedtime. 07/23/19   Zehr, Janett Billow D, PA-C  ondansetron (ZOFRAN-ODT) 4 MG disintegrating tablet Take 4 mg by mouth every 8 (eight) hours as needed for nausea or vomiting.    [provider]  PARoxetine (PAXIL) 20 MG tablet Take 20 mg by mouth daily.    [provider]  promethazine (PHENERGAN) 25 MG suppository Place 1 suppository (25 mg total) rectally every 6 (six) hours as needed for nausea or vomiting. 07/23/19   Zehr, Laban Emperor, PA-C  promethazine (PHENERGAN) 25 MG tablet Take 25 mg by mouth every 6 (six) hours as needed for nausea  or vomiting.    [provider]  promethazine (PHENERGAN) 25 MG tablet Take 25 mg by mouth every 8 (eight) hours as needed. 07/13/19   [provider]  PROMETHEGAN 25 MG suppository Place 1 suppository rectally daily as needed. 07/23/19   [provider]  SUMAtriptan (IMITREX) 100 MG tablet Take 100 mg by mouth every 2 (two) hours as needed for migraine. May repeat in 2 hours if headache persists or recurs.    [provider]  temazepam (RESTORIL) 30 MG capsule Take 30 mg by mouth at bedtime as needed for sleep.    [provider]  triamcinolone cream (KENALOG) 0.1 % Apply 1 application topically as needed.    [provider]    Current Facility-Administered Medications  Medication Dose Route Frequency Provider Last Rate Last Admin  . 0.9 %  sodium chloride infusion   Intravenous Continuous Mariel Aloe, MD 100 mL/hr at 07/31/19 0600 New Bag at 07/31/19 0600  . acetaminophen (TYLENOL) tablet 650 mg  650 mg Oral Q6H PRN Mariel Aloe, MD       Or  . acetaminophen (TYLENOL) suppository 650 mg  650 mg Rectal Q6H PRN Mariel Aloe, MD      . amLODipine (NORVASC) tablet 10 mg  10 mg  Oral Daily Mariel Aloe, MD      . cloNIDine (CATAPRES - Dosed in mg/24 hr) patch 0.2 mg  0.2 mg Transdermal Weekly Mariel Aloe, MD   0.2 mg at 07/30/19 1214  . HYDROcodone-acetaminophen (NORCO/VICODIN) 5-325 MG per tablet 1-2 tablet  1-2 tablet Oral Q6H PRN Mariel Aloe, MD   2 tablet at 07/30/19 1804  . hydrOXYzine (ATARAX/VISTARIL) tablet 100 mg  100 mg Oral QHS Mariel Aloe, MD   100 mg at 07/30/19 2153  . metoprolol succinate (TOPROL-XL) 24 hr tablet 50 mg  50 mg Oral Daily Mariel Aloe, MD   50 mg at 07/30/19 1726  . mirtazapine (REMERON) tablet 30 mg  30 mg Oral QHS Mariel Aloe, MD   30 mg at 07/30/19 2153  . morphine 2 MG/ML injection 2-4 mg  2-4 mg Intravenous Q4H PRN Mariel Aloe, MD   4 mg at 07/31/19 0555  . ondansetron (ZOFRAN) injection 4 mg  4 mg Intravenous Q6H PRN Mariel Aloe, MD      . pantoprazole (PROTONIX) EC tablet 40 mg  40 mg Oral BID Mariel Aloe, MD   40 mg at 07/30/19 2153  . PARoxetine (PAXIL) tablet 20 mg  20 mg Oral QHS Mariel Aloe, MD   20 mg at 07/30/19 2153  . polyethylene glycol (MIRALAX / GLYCOLAX) packet 17 g  17 g Oral Daily PRN Mariel Aloe, MD      . promethazine (PHENERGAN) injection 12.5 mg  12.5 mg Intravenous Q6H PRN Mariel Aloe, MD        Allergies as of 07/30/2019 - Review Complete 07/30/2019  Allergen Reaction Noted  . Amoxicillin Anaphylaxis 07/23/2019  . Penicillins Anaphylaxis 07/23/2019  . Penicillins Anaphylaxis and Swelling 07/30/2019  . Sulfa antibiotics Anaphylaxis 07/23/2019  . Sulfa antibiotics Anaphylaxis and Swelling 07/30/2019    Family History  Problem Relation Age of Onset  . Stomach cancer Father        lymphoma  . Pancreatic cancer Maternal Grandmother   . Pancreatic cancer Maternal Aunt   . Pancreatic cancer Maternal Uncle   . Colon cancer Neg Hx   . Colon polyps  Neg Hx   . Pancreatic cancer Other        Aunt and uncle    Social History   Socioeconomic History  . Marital  status: Unknown    Spouse name: Not on file  . Number of children: Not on file  . Years of education: Not on file  . Highest education level: Not on file  Occupational History  . Not on file  Tobacco Use  . Smoking status: Current Every Day Smoker    Packs/day: 0.50  . Smokeless tobacco: Current User  Substance and Sexual Activity  . Alcohol use: Yes    Comment: rare  . Drug use: Never  . Sexual activity: Yes  Other Topics Concern  . Not on file  Social History Narrative   ** Merged History Encounter **       Social Determinants of Health   Financial Resource Strain:   . Difficulty of Paying Living Expenses: Not on file  Food Insecurity:   . Worried About Charity fundraiser in the Last Year: Not on file  . Ran Out of Food in the Last Year: Not on file  Transportation Needs:   . Lack of Transportation (Medical): Not on file  . Lack of Transportation (Non-Medical): Not on file  Physical Activity:   . Days of Exercise per Week: Not on file  . Minutes of Exercise per Session: Not on file  Stress:   . Feeling of Stress : Not on file  Social Connections:   . Frequency of Communication with Friends and Family: Not on file  . Frequency of Social Gatherings with Friends and Family: Not on file  . Attends Religious Services: Not on file  . Active Member of Clubs or Organizations: Not on file  . Attends Archivist Meetings: Not on file  . Marital Status: Not on file  Intimate Partner Violence:   . Fear of Current or Ex-Partner: Not on file  . Emotionally Abused: Not on file  . Physically Abused: Not on file  . Sexually Abused: Not on file    Review of Systems: Pertinent positive and negative review of systems were noted in the above HPI section.  All other review of systems was otherwise negative.Marland Kitchen  Physical Exam: Vital signs in last 24 hours: Temp:  [97.8 F (36.6 C)-98.3 F (36.8 C)] 98.2 F (36.8 C) (02/23 0347) Pulse Rate:  [101-128] 106 (02/23  0347) Resp:  [13-24] 17 (02/23 0347) BP: (133-173)/(77-106) 133/79 (02/23 0347) SpO2:  [91 %-100 %] 94 % (02/23 0347) Weight:  [113.4 kg] 113.4 kg (02/22 0950) Last BM Date: 07/30/19 General:   Alert,  Well-developed, well-nourished, pleasant and cooperative in NAD Head:  Normocephalic and atraumatic. Eyes:  Sclera clear, no icterus.   Conjunctiva pink. Ears:  Normal auditory acuity. Nose:  No deformity, discharge,  or lesions. Mouth:  No deformity or lesions.   Neck:  Supple; no masses or thyromegaly. Lungs:  Clear throughout to auscultation.   No wheezes, crackles, or rhonchi. Heart:  Regular rate and rhythm; no murmurs, clicks, rubs,  or gallops. Abdomen:  Soft,nontender, BS active,nonpalp mass or hsm.   Rectal:  Deferred  Msk:  Symmetrical without gross deformities. . Pulses:  Normal pulses noted. Extremities:  Without clubbing or edema. Neurologic:  Alert and  oriented x4;  grossly normal neurologically. Skin:  Intact without significant lesions or rashes.. Psych:  Alert and cooperative. Normal mood and affect.  Intake/Output from previous day: 02/22 0701 - 02/23  0700 In: 1619.5 [P.O.:120; I.V.:999.5; IV Piggyback:500] Out: 0  Intake/Output this shift: No intake/output data recorded.  Lab Results: Recent Labs    07/30/19 1034 07/31/19 0452  WBC 17.7* 12.6*  HGB 14.8 13.0  HCT 45.5 41.6  PLT 380 307   BMET Recent Labs    07/30/19 1034 07/31/19 0452  NA 140 141  K 4.2 4.1  CL 104 108  CO2 24 24  GLUCOSE 129* 108*  BUN 20 18  CREATININE 1.51* 0.95  CALCIUM 9.5 8.5*   LFT Recent Labs    07/31/19 0452  PROT 6.5  ALBUMIN 3.9  AST 19  ALT 18  ALKPHOS 56  BILITOT 0.6   PT/INR Recent Labs    07/30/19 1034  LABPROT 12.1  INR 0.9   Hepatitis Panel No results for input(s): HEPBSAG, HCVAB, HEPAIGM, HEPBIGM in the last 72 hours.    Studies/Results:    IMPRESSION:  #48 49 year old white female with 1-1/2-year history of episodic nausea and  vomiting since cholecystectomy in 2019.  Severity and frequency has significantly increased over the past couple of months with daily symptoms and often inability to keep down meds. Etiology is not clear.  She has also been having severe hypertension, I think we need to consider whether the 2 are related.  Question consider pheochromocytoma, or other hormone related syndrome Doubt alpha gal but will check  Her symptoms are not consistent with gastroparesis by history but will need to rule out. Interesting she has not had any weight loss Rule out gastropathy, peptic ulcer disease, no evidence of obstruction on prior imaging by reports.  #2 anxiety/depression #3 status post cholecystectomy hysterectomy and appendectomy  Plan; n.p.o. this a.m., patient has been scheduled for EGD with Dr. Bryan Lemma this afternoon.  Procedure was discussed in detail with patient including indications risks and benefits and she is agreeable to proceed. Continue twice daily PPI We will change antiemetic to Zofran every 6 hours around-the-clock.  Patient had not been offered any regular regimen of antiemetics as an outpatient, and this may be extremely helpful.  We will request records from [facility] Consider plasma metanephrines and catecholamines. Thank you we will follow     Layana Konkel PA-C 07/31/2019, 8:52 AM

## 2019-07-31 NOTE — Transfer of Care (Signed)
Immediate Anesthesia Transfer of Care Note  Patient: Alicia Krueger  Procedure(s) Performed: Procedure(s): ESOPHAGOGASTRODUODENOSCOPY (EGD) WITH PROPOFOL (N/A) BIOPSY  Patient Location: PACU  Anesthesia Type:MAC  Level of Consciousness:  sedated, patient cooperative and responds to stimulation  Airway & Oxygen Therapy:Patient Spontanous Breathing and Patient connected to face mask oxgen  Post-op Assessment:  Report given to PACU RN and Post -op Vital signs reviewed and stable  Post vital signs:  Reviewed and stable  Last Vitals:  Vitals:   07/31/19 1243 07/31/19 1245  BP:  (!) 143/72  Pulse: 98   Resp: 17   Temp: 37 C   SpO2: 76%     Complications: No apparent anesthesia complications

## 2019-07-31 NOTE — Anesthesia Preprocedure Evaluation (Addendum)
Anesthesia Evaluation  Patient identified by MRN, date of birth, ID band Patient awake    Reviewed: Allergy & Precautions, NPO status , Patient's Chart, lab work & pertinent test results, reviewed documented beta blocker date and time   Airway Mallampati: II  TM Distance: >3 FB Neck ROM: Full    Dental  (+) Teeth Intact, Dental Advisory Given   Pulmonary Current Smoker,  Smoking less now, used to smoke 2 ppd x 20 years   Pulmonary exam normal breath sounds clear to auscultation       Cardiovascular hypertension, Pt. on medications and Pt. on home beta blockers Normal cardiovascular exam Rhythm:Regular Rate:Normal  Hypertensive crisis as outpatient, is on many antihypertensives now. BP 147/84 in preop   Neuro/Psych PSYCHIATRIC DISORDERS Depression negative neurological ROS     GI/Hepatic Neg liver ROS, Persistent N/V   Endo/Other  Morbid obesityBMI 43  Renal/GU negative Renal ROS  negative genitourinary   Musculoskeletal negative musculoskeletal ROS (+)   Abdominal (+) + obese,   Peds negative pediatric ROS (+)  Hematology negative hematology ROS (+)   Anesthesia Other Findings   Reproductive/Obstetrics negative OB ROS                            Anesthesia Physical Anesthesia Plan  ASA: III  Anesthesia Plan: MAC   Post-op Pain Management:    Induction:   PONV Risk Score and Plan: 2 and Propofol infusion and TIVA  Airway Management Planned: Natural Airway and Simple Face Mask  Additional Equipment: None  Intra-op Plan:   Post-operative Plan:   Informed Consent: I have reviewed the patients History and Physical, chart, labs and discussed the procedure including the risks, benefits and alternatives for the proposed anesthesia with the patient or authorized representative who has indicated his/her understanding and acceptance.       Plan Discussed with: CRNA  Anesthesia  Plan Comments:         Anesthesia Quick Evaluation

## 2019-07-31 NOTE — H&P (View-Only) (Signed)
Consultation  Referring Provider: [facility]/ Nettey MD Primary Care Physician:  Patient, No Pcp Per Primary Gastroenterologist: Dr. Tarri Glenn  Reason for Consultation: Persistent nausea and vomiting  HPI: Alicia Krueger is a 49 y.o. female, who we are asked to evaluate regarding persistent nausea and vomiting.  Patient was admitted through the emergency room yesterday afternoon with inability to keep down p.o.'s over this past weekend and multiple episodes of nausea and vomiting.  She says she been unable to keep her meds down and also had a hypertensive emergency with blood pressure of 230/181 recorded as an outpatient. When records were reviewed last evening was no indication that she had been seen in our office, not sure if charts have been merged however her recent office visit is now visible.  She was seen by Alonza Bogus on 07/23/2019 and scheduled for EGD with Dr. Tarri Glenn later this week.  Patient is status post cholecystectomy appendectomy and hysterectomy.  She had her gallbladder out about 2 years ago at [place].  She did have gallstones.  She says ever since her gallbladder was removed she has been having problems with recurrent nausea and vomiting.  She was seen by Dr. Butler/gastroenterology in [place] over a year ago and underwent EGD by her report.  She says she was told she may have some ulcers.  Around that same time she lost her job and insurance and did not have any further work-up for a while.  Around November she started having increased difficulty with repeated episodes of nausea and vomiting often intractable and vomiting for hours at a time.  Interestingly around the same time her father had passed away, and she had had an incredibly stressful year by her to that time.  She had been on Remeron prior to that time and Paxil was added to her regimen. She had been seen in the emergency room at [facility] apparently in November with 1 of these episodes, had CT imaging  that she says was negative, was treated with antiemetics and PPI and sent home.  She does not feel that Zofran even ODT helps her symptoms much. She relates that she has been having intermittent nausea and vomiting ever since almost on a daily basis.  Vomiting will be random and not necessarily postprandially.  She is able to keep some meals down.  She describes a lot of heartburn and indigestion as well.  She says she will have abdominal pain after multiple episodes of vomiting which she says is usually hard retching. Interestingly she has not had any weight loss but feels this is because she is retaining fluid. About 2 weeks ago she was admitted to [facility] and had a 4-day stay.  She did not have GI consultation She was told by the hospitalist that she may have gastroparesis, no gastric emptying scan was done.  She was advised to be seen by GI on discharge.  Apparently she did have another CT scan at the time of that admission which she was told was negative. She has had persistent symptoms ever since and had a horrible weekend this past weekend.  She is also been noticing that her blood pressures have been extremely high especially when she cannot keep her meds down.  This is been associated with some sensation of shakiness, flushing and headaches. She is feeling better this morning, blood pressure under much better control with IV meds and is not vomiting. Denies any regular EtOH, no regular NSAIDs, no cannabis. Had been  given a prescription for metoclopramide to take once daily but did not feel that was helping. She says some days she can tolerate red meat okay, chicken seems to be bothering her recently with increased vomiting.   Past Medical History:  Diagnosis Date  . Depression   . Essential hypertension   . Insomnia     Past Surgical History:  Procedure Laterality Date  . ABDOMINAL HYSTERECTOMY    . APPENDECTOMY    . CHOLECYSTECTOMY    . DILATION AND CURETTAGE OF UTERUS      x 4  . PARTIAL HYSTERECTOMY      Prior to Admission medications   Medication Sig Start Date End Date Taking? Authorizing Provider  amLODipine (NORVASC) 10 MG tablet Take 10 mg by mouth daily. 07/13/19  Yes [provider]  cloNIDine (CATAPRES) 0.2 MG tablet Take 0.2-0.4 mg by mouth at bedtime. 07/13/19  Yes [provider]  hydrOXYzine (ATARAX/VISTARIL) 50 MG tablet Take 100 mg by mouth at bedtime. 07/04/19  Yes [provider]  ibuprofen (ADVIL) 200 MG tablet Take 800 mg by mouth every 6 (six) hours as needed for mild pain or moderate pain.   Yes [provider]  metoprolol succinate (TOPROL-XL) 50 MG 24 hr tablet Take 50 mg by mouth at bedtime.  07/13/19  Yes [provider]  mirtazapine (REMERON) 30 MG tablet Take 30 mg by mouth at bedtime. 07/04/19  Yes [provider]  omeprazole (PRILOSEC) 40 MG capsule Take 80 mg by mouth 2 (two) times daily. 07/23/19  Yes [provider]  PARoxetine (PAXIL) 20 MG tablet Take 20 mg by mouth at bedtime.  07/04/19  Yes [provider]  SUMAtriptan (IMITREX) 100 MG tablet Take 100 mg by mouth every 2 (two) hours as needed for migraine.  06/11/19  Yes [provider]  amLODipine (NORVASC) 10 MG tablet Take 10 mg by mouth daily.    [provider]  clonazePAM (KLONOPIN) 1 MG tablet Take 1 mg by mouth at bedtime.    [provider]  cloNIDine (CATAPRES) 0.2 MG tablet Take 0.2 mg by mouth at bedtime as needed.    [provider]  hydrochlorothiazide (MICROZIDE) 12.5 MG capsule Take 12.5 mg by mouth daily.    [provider]  hydrOXYzine (ATARAX/VISTARIL) 50 MG tablet Take 50 mg by mouth at bedtime as needed.    [provider]  metoCLOPramide (REGLAN) 10 MG tablet Take 10 mg by mouth daily.    [provider]  metoprolol succinate (TOPROL-XL) 50 MG 24 hr tablet Take 50 mg by mouth daily. Take with or immediately following a meal.     [provider]  mirtazapine (REMERON) 30 MG tablet Take 30 mg by mouth at bedtime.    [provider]  omeprazole (PRILOSEC) 40 MG capsule Take 1 capsule (40 mg total) by mouth in the morning and at bedtime. 07/23/19   Zehr, Janett Billow D, PA-C  ondansetron (ZOFRAN-ODT) 4 MG disintegrating tablet Take 4 mg by mouth every 8 (eight) hours as needed for nausea or vomiting.    [provider]  PARoxetine (PAXIL) 20 MG tablet Take 20 mg by mouth daily.    [provider]  promethazine (PHENERGAN) 25 MG suppository Place 1 suppository (25 mg total) rectally every 6 (six) hours as needed for nausea or vomiting. 07/23/19   Zehr, Laban Emperor, PA-C  promethazine (PHENERGAN) 25 MG tablet Take 25 mg by mouth every 6 (six) hours as needed for nausea  or vomiting.    [provider]  promethazine (PHENERGAN) 25 MG tablet Take 25 mg by mouth every 8 (eight) hours as needed. 07/13/19   [provider]  PROMETHEGAN 25 MG suppository Place 1 suppository rectally daily as needed. 07/23/19   [provider]  SUMAtriptan (IMITREX) 100 MG tablet Take 100 mg by mouth every 2 (two) hours as needed for migraine. May repeat in 2 hours if headache persists or recurs.    [provider]  temazepam (RESTORIL) 30 MG capsule Take 30 mg by mouth at bedtime as needed for sleep.    [provider]  triamcinolone cream (KENALOG) 0.1 % Apply 1 application topically as needed.    [provider]    Current Facility-Administered Medications  Medication Dose Route Frequency Provider Last Rate Last Admin  . 0.9 %  sodium chloride infusion   Intravenous Continuous Mariel Aloe, MD 100 mL/hr at 07/31/19 0600 New Bag at 07/31/19 0600  . acetaminophen (TYLENOL) tablet 650 mg  650 mg Oral Q6H PRN Mariel Aloe, MD       Or  . acetaminophen (TYLENOL) suppository 650 mg  650 mg Rectal Q6H PRN Mariel Aloe, MD      . amLODipine (NORVASC) tablet 10 mg  10 mg  Oral Daily Mariel Aloe, MD      . cloNIDine (CATAPRES - Dosed in mg/24 hr) patch 0.2 mg  0.2 mg Transdermal Weekly Mariel Aloe, MD   0.2 mg at 07/30/19 1214  . HYDROcodone-acetaminophen (NORCO/VICODIN) 5-325 MG per tablet 1-2 tablet  1-2 tablet Oral Q6H PRN Mariel Aloe, MD   2 tablet at 07/30/19 1804  . hydrOXYzine (ATARAX/VISTARIL) tablet 100 mg  100 mg Oral QHS Mariel Aloe, MD   100 mg at 07/30/19 2153  . metoprolol succinate (TOPROL-XL) 24 hr tablet 50 mg  50 mg Oral Daily Mariel Aloe, MD   50 mg at 07/30/19 1726  . mirtazapine (REMERON) tablet 30 mg  30 mg Oral QHS Mariel Aloe, MD   30 mg at 07/30/19 2153  . morphine 2 MG/ML injection 2-4 mg  2-4 mg Intravenous Q4H PRN Mariel Aloe, MD   4 mg at 07/31/19 0555  . ondansetron (ZOFRAN) injection 4 mg  4 mg Intravenous Q6H PRN Mariel Aloe, MD      . pantoprazole (PROTONIX) EC tablet 40 mg  40 mg Oral BID Mariel Aloe, MD   40 mg at 07/30/19 2153  . PARoxetine (PAXIL) tablet 20 mg  20 mg Oral QHS Mariel Aloe, MD   20 mg at 07/30/19 2153  . polyethylene glycol (MIRALAX / GLYCOLAX) packet 17 g  17 g Oral Daily PRN Mariel Aloe, MD      . promethazine (PHENERGAN) injection 12.5 mg  12.5 mg Intravenous Q6H PRN Mariel Aloe, MD        Allergies as of 07/30/2019 - Review Complete 07/30/2019  Allergen Reaction Noted  . Amoxicillin Anaphylaxis 07/23/2019  . Penicillins Anaphylaxis 07/23/2019  . Penicillins Anaphylaxis and Swelling 07/30/2019  . Sulfa antibiotics Anaphylaxis 07/23/2019  . Sulfa antibiotics Anaphylaxis and Swelling 07/30/2019    Family History  Problem Relation Age of Onset  . Stomach cancer Father        lymphoma  . Pancreatic cancer Maternal Grandmother   . Pancreatic cancer Maternal Aunt   . Pancreatic cancer Maternal Uncle   . Colon cancer Neg Hx   . Colon polyps  Neg Hx   . Pancreatic cancer Other        Aunt and uncle    Social History   Socioeconomic History  . Marital  status: Unknown    Spouse name: Not on file  . Number of children: Not on file  . Years of education: Not on file  . Highest education level: Not on file  Occupational History  . Not on file  Tobacco Use  . Smoking status: Current Every Day Smoker    Packs/day: 0.50  . Smokeless tobacco: Current User  Substance and Sexual Activity  . Alcohol use: Yes    Comment: rare  . Drug use: Never  . Sexual activity: Yes  Other Topics Concern  . Not on file  Social History Narrative   ** Merged History Encounter **       Social Determinants of Health   Financial Resource Strain:   . Difficulty of Paying Living Expenses: Not on file  Food Insecurity:   . Worried About Charity fundraiser in the Last Year: Not on file  . Ran Out of Food in the Last Year: Not on file  Transportation Needs:   . Lack of Transportation (Medical): Not on file  . Lack of Transportation (Non-Medical): Not on file  Physical Activity:   . Days of Exercise per Week: Not on file  . Minutes of Exercise per Session: Not on file  Stress:   . Feeling of Stress : Not on file  Social Connections:   . Frequency of Communication with Friends and Family: Not on file  . Frequency of Social Gatherings with Friends and Family: Not on file  . Attends Religious Services: Not on file  . Active Member of Clubs or Organizations: Not on file  . Attends Archivist Meetings: Not on file  . Marital Status: Not on file  Intimate Partner Violence:   . Fear of Current or Ex-Partner: Not on file  . Emotionally Abused: Not on file  . Physically Abused: Not on file  . Sexually Abused: Not on file    Review of Systems: Pertinent positive and negative review of systems were noted in the above HPI section.  All other review of systems was otherwise negative.Marland Kitchen  Physical Exam: Vital signs in last 24 hours: Temp:  [97.8 F (36.6 C)-98.3 F (36.8 C)] 98.2 F (36.8 C) (02/23 0347) Pulse Rate:  [101-128] 106 (02/23  0347) Resp:  [13-24] 17 (02/23 0347) BP: (133-173)/(77-106) 133/79 (02/23 0347) SpO2:  [91 %-100 %] 94 % (02/23 0347) Weight:  [113.4 kg] 113.4 kg (02/22 0950) Last BM Date: 07/30/19 General:   Alert,  Well-developed, well-nourished, pleasant and cooperative in NAD Head:  Normocephalic and atraumatic. Eyes:  Sclera clear, no icterus.   Conjunctiva pink. Ears:  Normal auditory acuity. Nose:  No deformity, discharge,  or lesions. Mouth:  No deformity or lesions.   Neck:  Supple; no masses or thyromegaly. Lungs:  Clear throughout to auscultation.   No wheezes, crackles, or rhonchi. Heart:  Regular rate and rhythm; no murmurs, clicks, rubs,  or gallops. Abdomen:  Soft,nontender, BS active,nonpalp mass or hsm.   Rectal:  Deferred  Msk:  Symmetrical without gross deformities. . Pulses:  Normal pulses noted. Extremities:  Without clubbing or edema. Neurologic:  Alert and  oriented x4;  grossly normal neurologically. Skin:  Intact without significant lesions or rashes.. Psych:  Alert and cooperative. Normal mood and affect.  Intake/Output from previous day: 02/22 0701 - 02/23  0700 In: 1619.5 [P.O.:120; I.V.:999.5; IV Piggyback:500] Out: 0  Intake/Output this shift: No intake/output data recorded.  Lab Results: Recent Labs    07/30/19 1034 07/31/19 0452  WBC 17.7* 12.6*  HGB 14.8 13.0  HCT 45.5 41.6  PLT 380 307   BMET Recent Labs    07/30/19 1034 07/31/19 0452  NA 140 141  K 4.2 4.1  CL 104 108  CO2 24 24  GLUCOSE 129* 108*  BUN 20 18  CREATININE 1.51* 0.95  CALCIUM 9.5 8.5*   LFT Recent Labs    07/31/19 0452  PROT 6.5  ALBUMIN 3.9  AST 19  ALT 18  ALKPHOS 56  BILITOT 0.6   PT/INR Recent Labs    07/30/19 1034  LABPROT 12.1  INR 0.9   Hepatitis Panel No results for input(s): HEPBSAG, HCVAB, HEPAIGM, HEPBIGM in the last 72 hours.    Studies/Results:    IMPRESSION:  #53 49 year old white female with 1-1/2-year history of episodic nausea and  vomiting since cholecystectomy in 2019.  Severity and frequency has significantly increased over the past couple of months with daily symptoms and often inability to keep down meds. Etiology is not clear.  She has also been having severe hypertension, I think we need to consider whether the 2 are related.  Question consider pheochromocytoma, or other hormone related syndrome Doubt alpha gal but will check  Her symptoms are not consistent with gastroparesis by history but will need to rule out. Interesting she has not had any weight loss Rule out gastropathy, peptic ulcer disease, no evidence of obstruction on prior imaging by reports.  #2 anxiety/depression #3 status post cholecystectomy hysterectomy and appendectomy  Plan; n.p.o. this a.m., patient has been scheduled for EGD with Dr. Bryan Lemma this afternoon.  Procedure was discussed in detail with patient including indications risks and benefits and she is agreeable to proceed. Continue twice daily PPI We will change antiemetic to Zofran every 6 hours around-the-clock.  Patient had not been offered any regular regimen of antiemetics as an outpatient, and this may be extremely helpful.  We will request records from [facility] Consider plasma metanephrines and catecholamines. Thank you we will follow     Gelisa Tieken PA-C 07/31/2019, 8:52 AM

## 2019-08-01 ENCOUNTER — Encounter (HOSPITAL_COMMUNITY): Payer: Self-pay | Admitting: Family Medicine

## 2019-08-01 ENCOUNTER — Observation Stay (HOSPITAL_COMMUNITY): Payer: BC Managed Care – PPO

## 2019-08-01 ENCOUNTER — Ambulatory Visit (INDEPENDENT_AMBULATORY_CARE_PROVIDER_SITE_OTHER): Payer: BC Managed Care – PPO

## 2019-08-01 DIAGNOSIS — Z882 Allergy status to sulfonamides status: Secondary | ICD-10-CM | POA: Diagnosis not present

## 2019-08-01 DIAGNOSIS — K269 Duodenal ulcer, unspecified as acute or chronic, without hemorrhage or perforation: Secondary | ICD-10-CM | POA: Diagnosis present

## 2019-08-01 DIAGNOSIS — K253 Acute gastric ulcer without hemorrhage or perforation: Secondary | ICD-10-CM

## 2019-08-01 DIAGNOSIS — K2960 Other gastritis without bleeding: Secondary | ICD-10-CM | POA: Diagnosis present

## 2019-08-01 DIAGNOSIS — F17210 Nicotine dependence, cigarettes, uncomplicated: Secondary | ICD-10-CM | POA: Diagnosis present

## 2019-08-01 DIAGNOSIS — F419 Anxiety disorder, unspecified: Secondary | ICD-10-CM | POA: Diagnosis present

## 2019-08-01 DIAGNOSIS — Z1159 Encounter for screening for other viral diseases: Secondary | ICD-10-CM

## 2019-08-01 DIAGNOSIS — R000 Tachycardia, unspecified: Secondary | ICD-10-CM

## 2019-08-01 DIAGNOSIS — Z9049 Acquired absence of other specified parts of digestive tract: Secondary | ICD-10-CM | POA: Diagnosis not present

## 2019-08-01 DIAGNOSIS — Z800 Family history of malignant neoplasm of digestive organs: Secondary | ICD-10-CM | POA: Diagnosis not present

## 2019-08-01 DIAGNOSIS — Z90711 Acquired absence of uterus with remaining cervical stump: Secondary | ICD-10-CM | POA: Diagnosis not present

## 2019-08-01 DIAGNOSIS — Z6841 Body Mass Index (BMI) 40.0 and over, adult: Secondary | ICD-10-CM | POA: Diagnosis not present

## 2019-08-01 DIAGNOSIS — I10 Essential (primary) hypertension: Secondary | ICD-10-CM | POA: Diagnosis present

## 2019-08-01 DIAGNOSIS — K2990 Gastroduodenitis, unspecified, without bleeding: Secondary | ICD-10-CM | POA: Diagnosis present

## 2019-08-01 DIAGNOSIS — I161 Hypertensive emergency: Secondary | ICD-10-CM

## 2019-08-01 DIAGNOSIS — K259 Gastric ulcer, unspecified as acute or chronic, without hemorrhage or perforation: Secondary | ICD-10-CM | POA: Diagnosis present

## 2019-08-01 DIAGNOSIS — Z20822 Contact with and (suspected) exposure to covid-19: Secondary | ICD-10-CM | POA: Diagnosis present

## 2019-08-01 DIAGNOSIS — Z880 Allergy status to penicillin: Secondary | ICD-10-CM | POA: Diagnosis not present

## 2019-08-01 DIAGNOSIS — R112 Nausea with vomiting, unspecified: Secondary | ICD-10-CM | POA: Diagnosis not present

## 2019-08-01 DIAGNOSIS — N179 Acute kidney failure, unspecified: Secondary | ICD-10-CM | POA: Diagnosis present

## 2019-08-01 DIAGNOSIS — K319 Disease of stomach and duodenum, unspecified: Secondary | ICD-10-CM | POA: Diagnosis present

## 2019-08-01 DIAGNOSIS — Z66 Do not resuscitate: Secondary | ICD-10-CM | POA: Diagnosis present

## 2019-08-01 DIAGNOSIS — F329 Major depressive disorder, single episode, unspecified: Secondary | ICD-10-CM | POA: Diagnosis present

## 2019-08-01 DIAGNOSIS — K2970 Gastritis, unspecified, without bleeding: Secondary | ICD-10-CM | POA: Diagnosis not present

## 2019-08-01 DIAGNOSIS — Z807 Family history of other malignant neoplasms of lymphoid, hematopoietic and related tissues: Secondary | ICD-10-CM | POA: Diagnosis not present

## 2019-08-01 DIAGNOSIS — Z79899 Other long term (current) drug therapy: Secondary | ICD-10-CM | POA: Diagnosis not present

## 2019-08-01 DIAGNOSIS — I160 Hypertensive urgency: Secondary | ICD-10-CM | POA: Diagnosis not present

## 2019-08-01 DIAGNOSIS — Z791 Long term (current) use of non-steroidal anti-inflammatories (NSAID): Secondary | ICD-10-CM | POA: Diagnosis not present

## 2019-08-01 LAB — BASIC METABOLIC PANEL
Anion gap: 7 (ref 5–15)
BUN: 11 mg/dL (ref 6–20)
CO2: 23 mmol/L (ref 22–32)
Calcium: 8.5 mg/dL — ABNORMAL LOW (ref 8.9–10.3)
Chloride: 110 mmol/L (ref 98–111)
Creatinine, Ser: 0.9 mg/dL (ref 0.44–1.00)
GFR calc Af Amer: >60 mL/min (ref >60)
GFR calc non Af Amer: >60 mL/min (ref >60)
Glucose, Bld: 106 mg/dL — ABNORMAL HIGH (ref 70–99)
Potassium: 4.6 mmol/L (ref 3.5–5.1)
Sodium: 140 mmol/L (ref 135–145)

## 2019-08-01 LAB — CBC
HCT: 40.6 % (ref 36.0–46.0)
Hemoglobin: 12.7 g/dL (ref 12.0–15.0)
MCH: 31.1 pg (ref 26.0–34.0)
MCHC: 31.3 g/dL (ref 30.0–36.0)
MCV: 99.5 fL (ref 80.0–100.0)
Platelets: 303 10*3/uL (ref 150–400)
RBC: 4.08 MIL/uL (ref 3.87–5.11)
RDW: 15.1 % (ref 11.5–15.5)
WBC: 11.3 10*3/uL — ABNORMAL HIGH (ref 4.0–10.5)
nRBC: 0 % (ref 0.0–0.2)

## 2019-08-01 LAB — URINE CULTURE

## 2019-08-01 LAB — SURGICAL PATHOLOGY

## 2019-08-01 MED ORDER — SODIUM CHLORIDE (PF) 0.9 % IJ SOLN
INTRAMUSCULAR | Status: AC
  Filled 2019-08-01: qty 50

## 2019-08-01 MED ORDER — IOHEXOL 300 MG/ML  SOLN
100.0000 mL | Freq: Once | INTRAMUSCULAR | Status: AC | PRN
  Administered 2019-08-01: 100 mL via INTRAVENOUS

## 2019-08-01 MED ORDER — ONDANSETRON HCL 4 MG PO TABS
4.0000 mg | ORAL_TABLET | Freq: Four times a day (QID) | ORAL | Status: DC
  Administered 2019-08-01 – 2019-08-02 (×4): 4 mg via ORAL
  Filled 2019-08-01 (×4): qty 1

## 2019-08-01 NOTE — Progress Notes (Addendum)
Patient ID: Alicia Krueger, female   DOB: 23-Sep-1970, 49 y.o.   MRN: [id number]    Progress Note   Subjective  Day #3 CC; persistent nausea vomiting, headaches and severe hypertension  EGD yesterday-gastritis and superficial nonbleeding linear duodenal ulcers-biopsies pending  She is feeling better today, regarding vomiting.  She has not attempted any solid food.  She continues to complain of headache which has been present off and on over the past couple of months, fairly constant recently and worse with elevation of blood pressure  ESR 4 Alpha gal pending   Objective   Vital signs in last 24 hours: Temp:  [97.7 F (36.5 C)-98.3 F (36.8 C)] 97.7 F (36.5 C) (02/24 0411) Pulse Rate:  [85-96] 95 (02/24 0910) Resp:  [13-26] 17 (02/24 0411) BP: (107-152)/(52-94) 123/91 (02/24 0910) SpO2:  [93 %-100 %] 97 % (02/24 0910) Last BM Date: 07/29/19 General:    white female in NAD, pleasant Heart:  Regular rate and rhythm; no murmurs Lungs: Respirations even and unlabored, lungs CTA bilaterally Abdomen:  Soft, nontender and nondistended. Normal bowel sounds. Extremities:  Without edema. Neurologic:  Alert and oriented,  grossly normal neurologically. Psych:  Cooperative. Normal mood and affect.  Intake/Output from previous day: 02/23 0701 - 02/24 0700 In: 1875 [P.O.:180; I.V.:1695] Out: -  Intake/Output this shift: No intake/output data recorded.  Lab Results: Recent Labs    07/30/19 1034 07/31/19 0452 08/01/19 0501  WBC 17.7* 12.6* 11.3*  HGB 14.8 13.0 12.7  HCT 45.5 41.6 40.6  PLT 380 307 303   BMET Recent Labs    07/30/19 1034 07/31/19 0452 08/01/19 0501  NA 140 141 140  K 4.2 4.1 4.6  CL 104 108 110  CO2 24 24 23   GLUCOSE 129* 108* 106*  BUN 20 18 11   CREATININE 1.51* 0.95 0.90  CALCIUM 9.5 8.5* 8.5*   LFT Recent Labs    07/31/19 0452  PROT 6.5  ALBUMIN 3.9  AST 19  ALT 18  ALKPHOS 56  BILITOT 0.6   PT/INR Recent Labs     07/30/19 1034  LABPROT 12.1  INR 0.9    Studies/Results: DG Abd Portable 1V  Result Date: 07/30/2019 CLINICAL DATA:  49 year old female with nausea vomiting. EXAM: PORTABLE ABDOMEN - 1 VIEW COMPARISON:  None. FINDINGS: Top-normal bowel loops measuring up to 2.7 cm in the left hemiabdomen. No bowel dilatation or evidence of obstruction. No free air. Right upper quadrant cholecystectomy clips. No acute osseous pathology. IMPRESSION: No bowel dilatation or evidence of obstruction. Electronically Signed   By: Anner Crete M.D.   On: 07/30/2019 19:13       Assessment / Plan:    #53 49 year old white female with persistent nausea and vomiting over the past 2 months, and periodic symptoms over the past year and a half since cholecystectomy. Etiology of the nausea and vomiting is still not clear. EGD yesterday with finding of gastritis and small superficial linear duodenal ulcers. I do not think the endoscopic findings explain the persistent nausea and vomiting. She has also been complaining of headaches and has had severe hypertension.  #2 anxiety/depression #3 status post cholecystectomy, hysterectomy and appendectomy  Plan; advance diet today We will try to convert meds to oral form i.e. Protonix and Zofran.  I think she will do best on Zofran 4 mg every 6 hours around-the-clock at least short-term to try to prevent episodes of vomiting  We will check CT of the head today Patient will need further  work-up for her severe hypertension, should have plasma metanephrines and catecholamines and 24-hour urine collection.,  Rule out pheochromocytoma.   I think these can be done as an outpatient and patient plans to establish with a new internist in [place] on discharge  If CT of the head is unrevealing, and patient can tolerate p.o.'s, she will be able to be discharged         Active Problems:   Hypertensive emergency   Intractable nausea and vomiting   Gastritis and  gastroduodenitis   Duodenal ulcer   Gastric erosion   Hypertensive urgency     LOS: 0 days   Amy Esterwood PA-C 08/01/2019, 12:53 PM       Attending physician's note   I have taken an interval history, reviewed the chart and examined the patient. I agree with the Advanced Practitioner's note, impression and recommendations.   Recurrent episodic N/V. ?  Etiology. EGD 2/23 mild erosive gastritis, superficial DUs. Bx-pending Hypertensive emergency Anxiety/depression/migraines  She does feel better today.  No further nausea/vomiting/abdominal pain.  Plan: -Advance diet.  If tolerates, can D/C home in a.m. -She will make FU appt with Dr Truman Hayward in [place] to evaluate for any secondary causes of hypertension. -Proceed with CT head to r/o any IC pathology. -Follow biopsies   Carmell Austria, MD [facility] [phone].

## 2019-08-01 NOTE — Progress Notes (Signed)
PROGRESS NOTE    Alicia Krueger  [id number] DOB: September 17, 1970 DOA: 07/30/2019 PCP: Patient, No Pcp Per    Brief Narrative:49 year old woman admitted from home on 2/22 due to uncontrolled nausea and vomiting, headache and high blood pressures.  She was found to have a blood pressure upon presentation of 230/181.  2 weeks ago she was admitted to [facility] for the same symptoms.  No EGD done at that time.  There was mention that she might have gastroparesis but she did not have a gastric emptying scan.  She did see [facility] as an outpatient last week and had been planned for an outpatient EGD.  Admission was requested for further evaluation and management.  Assessment & Plan:   Active Problems:   Hypertensive emergency   Intractable nausea and vomiting   Gastritis and gastroduodenitis   Duodenal ulcer   Gastric erosion   Hypertensive urgency  #1 intractable nausea and vomiting with headaches-recurrent patient has been having multiple hospital admissions for the same.  EGD done 07/31/2019 showed duodenal ulcer with gastritis which was biopsied. She still has not tolerated tolerated or taken any food by mouth.  GI has put her on clear liquids hopefully she can tolerate this. ?  If this is related to gastroparesis However we will do a CT scan of the head to rule out neuro related nausea and vomiting. Patient has been taking Phenergan and Zofran at home without any relief. Advance diet per GI Out of bed ambulate increase activity  #2 hypertensive emergency at the time of admission she had a blood pressure of 230/181.  She is on multiple medications at home for the same which she has not been taking due to nausea and vomiting. Norvasc metoprolol and clonidine.  Continue to hold HCTZ. Once his medications were restarted her blood pressure improved to normal. If she continues to have high blood pressure in spite of p.o. medications she can have a pheochromocytoma work-up as an  outpatient.  #3 history of migraines likely related to uncontrolled hypertension.  Check CT of the head.  #4 AKI creatinine 0.90 improved with IV hydration.  HCTZ on hold  #5 depression on Paxil  Estimated body mass index is 43.6 kg/m as calculated from the following:   Height as of this encounter: 5\' 4"  (1.626 m).   Weight as of this encounter: 115.2 kg.  DVT prophylaxis: Lovenox  code Status: DO NOT RESUSCITATE Family Communication: None Disposition Plan: Patient came from home plan is to discharge her home once she is able to tolerate p.o. intake and her CT of the head is back.  She is admitted with intractable nausea and vomiting.   Consultants:   GI  Procedures: Status post EGD 07/31/2019 Antimicrobials: None  Subjective: Patient resting in bed not had anything to eat continues to complain of nausea   Objective: Vitals:   07/31/19 2044 08/01/19 0411 08/01/19 0910 08/01/19 1407  BP: 119/72 107/72 (!) 123/91 127/76  Pulse: 85 94 95 89  Resp: 18 17    Temp: 97.9 F (36.6 C) 97.7 F (36.5 C)  98.7 F (37.1 C)  TempSrc: Oral Oral  Oral  SpO2: 97% 93% 97% 92%  Weight:      Height:        Intake/Output Summary (Last 24 hours) at 08/01/2019 1504 Last data filed at 08/01/2019 1300 Gross per 24 hour  Intake 2511.18 ml  Output --  Net 2511.18 ml   Filed Weights   07/30/19 0950  07/31/19 1243  Weight: 113.4 kg 115.2 kg    Examination:  General exam: Appears calm and comfortable  Respiratory system: Clear to auscultation. Respiratory effort normal. Cardiovascular system: S1 & S2 heard, RRR. No JVD, murmurs, rubs, gallops or clicks. No pedal edema. Gastrointestinal system: Abdomen is nondistended, soft and nontender. No organomegaly or masses felt. Normal bowel sounds heard. Central nervous system: Alert and oriented. No focal neurological deficits. Extremities: Symmetric 5 x 5 power. Skin: No rashes, lesions or ulcers Psychiatry: Judgement and insight appear  normal. Mood & affect appropriate.     Data Reviewed: I have personally reviewed following labs and imaging studies  CBC: Recent Labs  Lab 07/30/19 1034 07/31/19 0452 08/01/19 0501  WBC 17.7* 12.6* 11.3*  NEUTROABS 13.2*  --   --   HGB 14.8 13.0 12.7  HCT 45.5 41.6 40.6  MCV 96.8 99.8 99.5  PLT 380 307 [id number]   Basic Metabolic Panel: Recent Labs  Lab 07/30/19 1034 07/31/19 0452 08/01/19 0501  NA 140 141 140  K 4.2 4.1 4.6  CL 104 108 110  CO2 24 24 23   GLUCOSE 129* 108* 106*  BUN 20 18 11   CREATININE 1.51* 0.95 0.90  CALCIUM 9.5 8.5* 8.5*  MG 2.2  --   --   PHOS 3.3  --   --    GFR: Estimated Creatinine Clearance: 96.3 mL/min (by C-G formula based on SCr of 0.9 mg/dL). Liver Function Tests: Recent Labs  Lab 07/30/19 1034 07/31/19 0452  AST 20 19  ALT 21 18  ALKPHOS 72 56  BILITOT 0.7 0.6  PROT 8.2* 6.5  ALBUMIN 4.9 3.9   Recent Labs  Lab 07/30/19 1034  LIPASE 28   No results for input(s): AMMONIA in the last 168 hours. Coagulation Profile: Recent Labs  Lab 07/30/19 1034  INR 0.9   Cardiac Enzymes: No results for input(s): CKTOTAL, CKMB, CKMBINDEX, TROPONINI in the last 168 hours. BNP (last 3 results) No results for input(s): PROBNP in the last 8760 hours. HbA1C: No results for input(s): HGBA1C in the last 72 hours. CBG: No results for input(s): GLUCAP in the last 168 hours. Lipid Profile: No results for input(s): CHOL, HDL, LDLCALC, TRIG, CHOLHDL, LDLDIRECT in the last 72 hours. Thyroid Function Tests: Recent Labs    07/30/19 1034  TSH 0.417   Anemia Panel: No results for input(s): VITAMINB12, FOLATE, FERRITIN, TIBC, IRON, RETICCTPCT in the last 72 hours. Sepsis Labs: No results for input(s): PROCALCITON, LATICACIDVEN in the last 168 hours.  Recent Results (from the past 240 hour(s))  Urine culture     Status: Abnormal   Collection Time: 07/30/19 12:15 PM   Specimen: Urine, Random  Result Value Ref Range Status   Specimen  Description   Final    URINE, RANDOM Performed at [facility] [address] [place], [place] [address]    Special Requests   Final    NONE Performed at [facility], [place] [address], [place], [place] [address]    Culture MULTIPLE SPECIES PRESENT, SUGGEST RECOLLECTION (A)  Final   Report Status 08/01/2019 FINAL  Final  Respiratory Panel by RT PCR (Flu A&B, Covid) - Nasopharyngeal Swab     Status: None   Collection Time: 07/30/19  3:12 PM   Specimen: Nasopharyngeal Swab  Result Value Ref Range Status   SARS Coronavirus 2 by RT PCR NEGATIVE NEGATIVE Final    Comment: (NOTE) SARS-CoV-2 target nucleic acids are NOT DETECTED. The SARS-CoV-2 RNA is  generally detectable in upper respiratoy specimens during the acute phase of infection. The lowest concentration of SARS-CoV-2 viral copies this assay can detect is 131 copies/mL. A negative result does not preclude SARS-Cov-2 infection and should not be used as the sole basis for treatment or other patient management decisions. A negative result may occur with  improper specimen collection/handling, submission of specimen other than nasopharyngeal swab, presence of viral mutation(s) within the areas targeted by this assay, and inadequate number of viral copies (<131 copies/mL). A negative result must be combined with clinical observations, patient history, and epidemiological information. The expected result is Negative. Fact Sheet for Patients:  PinkCheek.be Fact Sheet for Healthcare Providers:  GravelBags.it This test is not yet ap proved or cleared by the [place] FDA and  has been authorized for detection and/or diagnosis of SARS-CoV-2 by FDA under an Emergency Use Authorization (EUA). This EUA will remain  in effect (meaning this test can be used) for the duration of the COVID-19 declaration under Section 564(b)(1) of the Act, 21  U.S.C. section 360bbb-3(b)(1), unless the authorization is terminated or revoked sooner.    Influenza A by PCR NEGATIVE NEGATIVE Final   Influenza B by PCR NEGATIVE NEGATIVE Final    Comment: (NOTE) The Xpert Xpress SARS-CoV-2/FLU/RSV assay is intended as an aid in  the diagnosis of influenza from Nasopharyngeal swab specimens and  should not be used as a sole basis for treatment. Nasal washings and  aspirates are unacceptable for Xpert Xpress SARS-CoV-2/FLU/RSV  testing. Fact Sheet for Patients: PinkCheek.be Fact Sheet for Healthcare Providers: GravelBags.it This test is not yet approved or cleared by the [place] FDA and  has been authorized for detection and/or diagnosis of SARS-CoV-2 by  FDA under an Emergency Use Authorization (EUA). This EUA will remain  in effect (meaning this test can be used) for the duration of the  Covid-19 declaration under Section 564(b)(1) of the Act, 21  U.S.C. section 360bbb-3(b)(1), unless the authorization is  terminated or revoked. Performed at [facility], [place] [address] [place], [place] [address]          Radiology Studies: DG Abd Portable 1V  Result Date: 07/30/2019 CLINICAL DATA:  49 year old female with nausea vomiting. EXAM: PORTABLE ABDOMEN - 1 VIEW COMPARISON:  None. FINDINGS: Top-normal bowel loops measuring up to 2.7 cm in the left hemiabdomen. No bowel dilatation or evidence of obstruction. No free air. Right upper quadrant cholecystectomy clips. No acute osseous pathology. IMPRESSION: No bowel dilatation or evidence of obstruction. Electronically Signed   By: Anner Crete M.D.   On: 07/30/2019 19:13        Scheduled Meds: . amLODipine  10 mg Oral Daily  . cloNIDine  0.2 mg Transdermal Weekly  . hydrOXYzine  100 mg Oral QHS  . metoprolol succinate  50 mg Oral Daily  . mirtazapine  30 mg Oral QHS  . ondansetron  4 mg Oral Q6H  .  pantoprazole  40 mg Oral BID  . PARoxetine  20 mg Oral QHS  . sodium chloride (PF)      . sucralfate  1 g Oral TID WC & HS   Continuous Infusions: . sodium chloride 10 mL/hr at 08/01/19 1256     LOS: 0 days     Georgette Shell, MD  08/01/2019, 3:04 PM

## 2019-08-02 ENCOUNTER — Encounter: Payer: Self-pay | Admitting: *Deleted

## 2019-08-02 DIAGNOSIS — I160 Hypertensive urgency: Secondary | ICD-10-CM | POA: Diagnosis not present

## 2019-08-02 DIAGNOSIS — K269 Duodenal ulcer, unspecified as acute or chronic, without hemorrhage or perforation: Secondary | ICD-10-CM | POA: Diagnosis not present

## 2019-08-02 DIAGNOSIS — K2990 Gastroduodenitis, unspecified, without bleeding: Principal | ICD-10-CM

## 2019-08-02 DIAGNOSIS — R000 Tachycardia, unspecified: Secondary | ICD-10-CM | POA: Diagnosis not present

## 2019-08-02 DIAGNOSIS — R112 Nausea with vomiting, unspecified: Secondary | ICD-10-CM | POA: Diagnosis not present

## 2019-08-02 MED ORDER — CLONIDINE 0.2 MG/24HR TD PTWK: 0.2000 mg | MEDICATED_PATCH | TRANSDERMAL | 4 refills | Status: AC

## 2019-08-02 MED ORDER — ACETAMINOPHEN 325 MG PO TABS: 650.0000 mg | ORAL_TABLET | Freq: Four times a day (QID) | ORAL | Status: AC | PRN

## 2019-08-02 MED ORDER — SUCRALFATE 1 GM/10ML PO SUSP: 1.0000 g | Freq: Three times a day (TID) | ORAL | 0 refills | Status: AC

## 2019-08-02 MED ORDER — POLYETHYLENE GLYCOL 3350 17 G PO PACK: 17.0000 g | PACK | Freq: Every day | ORAL | 0 refills | Status: AC | PRN

## 2019-08-02 NOTE — Progress Notes (Signed)
Patient ID: Alicia Krueger, female   DOB: 11-Jul-1970, 49 y.o.   MRN: [id number]    Progress Note   Subjective  Day #3 CC; persistent nausea and vomiting/headaches  CT the head-unremarkable  Biopsies-gastric/nonspecific reactive gastropathy, duodenal reactive changes.  Patient is feeling better, was able to tolerate solid food yesterday and this morning without nausea or vomiting. Blood pressures have been under good control     Objective   Vital signs in last 24 hours: Temp:  [97.7 F (36.5 C)-98.7 F (37.1 C)] 97.7 F (36.5 C) (02/25 0534) Pulse Rate:  [89-96] 94 (02/25 0534) Resp:  [17] 17 (02/25 0534) BP: (114-127)/(66-91) 114/74 (02/25 0534) SpO2:  [90 %-97 %] 90 % (02/25 0534) Last BM Date: 08/01/19 General:    white female in NAD Heart:  Regular rate and rhythm; no murmurs Lungs: Respirations even and unlabored, lungs CTA bilaterally Abdomen:  Soft, nontender and nondistended. Normal bowel sounds. Extremities:  Without edema. Neurologic:  Alert and oriented,  grossly normal neurologically. Psych:  Cooperative. Normal mood and affect.  Intake/Output from previous day: 02/24 0701 - 02/25 0700 In: 1178.4 [P.O.:360; I.V.:818.4] Out: -  Intake/Output this shift: Total I/O In: 24.8 [I.V.:24.8] Out: -   Lab Results: Recent Labs    07/30/19 1034 07/31/19 0452 08/01/19 0501  WBC 17.7* 12.6* 11.3*  HGB 14.8 13.0 12.7  HCT 45.5 41.6 40.6  PLT 380 307 303   BMET Recent Labs    07/30/19 1034 07/31/19 0452 08/01/19 0501  NA 140 141 140  K 4.2 4.1 4.6  CL 104 108 110  CO2 24 24 23   GLUCOSE 129* 108* 106*  BUN 20 18 11   CREATININE 1.51* 0.95 0.90  CALCIUM 9.5 8.5* 8.5*   LFT Recent Labs    07/31/19 0452  PROT 6.5  ALBUMIN 3.9  AST 19  ALT 18  ALKPHOS 56  BILITOT 0.6   PT/INR Recent Labs    07/30/19 1034  LABPROT 12.1  INR 0.9    Studies/Results: CT HEAD W & WO CONTRAST  Result Date: 08/01/2019 CLINICAL DATA:  Chronic headache  EXAM: CT HEAD WITHOUT AND WITH CONTRAST TECHNIQUE: Contiguous axial images were obtained from the base of the skull through the vertex without and with intravenous contrast CONTRAST:  157mL OMNIPAQUE IOHEXOL 300 MG/ML  SOLN COMPARISON:  None. FINDINGS: Brain: No evidence of acute infarction, hemorrhage, hydrocephalus, extra-axial collection or mass lesion/mass effect. Normal enhancement following contrast administration. Vascular: Negative for hyperdense vessel. Normal venous enhancement. Skull: Negative Sinuses/Orbits: Negative Other: None IMPRESSION: Negative CT head with contrast. Electronically Signed   By: Franchot Gallo M.D.   On: 08/01/2019 15:23       Assessment / Plan:    #33 49 year old white female with persistent episodes of nausea vomiting over the past 2 months, and periodic symptoms over the past year and a half since post cholecystectomy Etiology of nausea and vomiting not entirely clear though she is significantly improved on current regimen.  EGD revealed gastritis and small superficial linear duodenal ulcers.  Biopsies were negative  #2 headaches-CT head negative, this may be associated with hypertension #3 hypertensive emergency-blood pressure is under much better control here with addition of clonidine patch, etc. He should have further outpatient work-up to rule out underlying pathology i.e. pheochromocytoma or other hormonal related syndrome  Plan; patient okay for discharge today from GI standpoint I encouraged her to continue Zofran 4 mg every 6 to 8 hours around-the-clock over the next couple of weeks  and then may scale back to twice daily but continue regularly over the next month or so. Please discharge on Protonix 40 mg p.o. twice daily Please discharge on Carafate suspension 1 g between meals and bedtime x2 weeks Please discharged with clonidine patch for hypertension and to be continued on Norvasc and metoprolol  She will follow up with internist in [place] She  will call for follow-up appointment with GI in about 1 month.    Active Problems:   Hypertensive emergency   Intractable nausea and vomiting   Gastritis and gastroduodenitis   Duodenal ulcer   Gastric erosion   Hypertensive urgency   Tachycardia     LOS: 1 day   Alicia Mcguffin EsterwoodPA-C  08/02/2019, 9:02 AM

## 2019-08-02 NOTE — Progress Notes (Signed)
Discharge paperwork discussed with pt at the bedside. She demonstrated understanding.  Pt escorted in stable condition to main lobby.

## 2019-08-02 NOTE — Plan of Care (Signed)
  Problem: Education: Goal: Knowledge of General Education information will improve Description: Including pain rating scale, medication(s)/side effects and non-pharmacologic comfort measures Outcome: Adequate for Discharge   Problem: Health Behavior/Discharge Planning: Goal: Ability to manage health-related needs will improve Outcome: Adequate for Discharge   Problem: Clinical Measurements: Goal: Ability to maintain clinical measurements within normal limits will improve Outcome: Adequate for Discharge Goal: Will remain free from infection Outcome: Adequate for Discharge Goal: Diagnostic test results will improve Outcome: Adequate for Discharge Goal: Respiratory complications will improve Outcome: Adequate for Discharge Goal: Cardiovascular complication will be avoided Outcome: Adequate for Discharge   Problem: Activity: Goal: Risk for activity intolerance will decrease Outcome: Adequate for Discharge   Problem: Nutrition: Goal: Adequate nutrition will be maintained Outcome: Adequate for Discharge   Problem: Coping: Goal: Level of anxiety will decrease Outcome: Adequate for Discharge   Problem: Elimination: Goal: Will not experience complications related to bowel motility Outcome: Adequate for Discharge Goal: Will not experience complications related to urinary retention Outcome: Adequate for Discharge   Problem: Pain Managment: Goal: General experience of comfort will improve Outcome: Adequate for Discharge   Problem: Safety: Goal: Ability to remain free from injury will improve Outcome: Adequate for Discharge   Problem: Skin Integrity: Goal: Risk for impaired skin integrity will decrease Outcome: Adequate for Discharge  Pt doing well, tolerating diet and ambulation.  Plan to d/c home per MD order.

## 2019-08-02 NOTE — Discharge Summary (Addendum)
Physician Discharge Summary  Alicia Krueger [id number] DOB: September 27, 1970 DOA: 07/30/2019  PCP: Patient, No Pcp Per  Admit date: 07/30/2019 Discharge date: 08/02/2019  Admitted From: home Disposition:  home Recommendations for Outpatient Follow-up:  1. Follow up with PCP in 1-2 weeks 2. Please obtain BMP/CBC in one week 3. Follow up with GI in [place] and follow up biopsy results   Home Health:none Equipment/Devices: None Discharge Condition: Stable CODE STATUS: DNR Diet recommendation: Cardiac diet Brief/Interim Summary:49 year old woman admitted from home on 2/22 due to uncontrolled nausea and vomiting,headache and high blood pressures. She was found to have a blood pressure upon presentation of 230/181. 2 weeks ago she was admitted to [facility] for the same symptoms. No EGD done at that time. There was mention that she might have gastroparesis but she did not have a gastric emptying scan. She did see [facility] as an outpatient last week and had been planned for an outpatient EGD. Admission was requested for further evaluation and management   Discharge Diagnoses:  Active Problems:   Hypertensive emergency   Intractable nausea and vomiting   Gastritis and gastroduodenitis   Duodenal ulcer   Gastric erosion   Hypertensive urgency   Tachycardia   #1 intractable nausea and vomiting with headaches-recurrent patient has been having multiple hospital admissions for the same.  EGD done 07/31/2019 showed duodenal ulcer with gastritis which was biopsied. She was able to tolerate a diet prior to discharge. CT of the head showed no acute abnormalities that could trigger nausea or vomiting.  #2 hypertensive emergency at the time of admission she had a blood pressure of 230/181.  She is on multiple medications at home for the same which she has not been taking due to nausea and vomiting. Continue Norvasc and metoprolol.  I have stopped her p.o. clonidine and given  her clonidine patches which she can use even if she ever were to have another episode of intractable nausea and vomiting.  I have stopped her HCTZ since she presented with AKI and blood pressure under good control without HCTZ.  Please restart this as an outpatient if needed.  #3 history of migraines likely related to uncontrolled hypertension.  CT head no acute abnormality.  #4 AKI creatinine 0.90 improved with IV hydration.  HCTZ on hold  #5 depression on Paxil   Estimated body mass index is 43.6 kg/m as calculated from the following:   Height as of this encounter: 5\' 4"  (1.626 m).   Weight as of this encounter: 115.2 kg.  Discharge Instructions  Discharge Instructions    Diet - low sodium heart healthy   Complete by: As directed    Increase activity slowly   Complete by: As directed      Allergies as of 08/02/2019      Reactions   Amoxicillin Anaphylaxis   Penicillins Anaphylaxis   Penicillins Anaphylaxis, Swelling   Did it involve swelling of the face/tongue/throat, SOB, or low BP? yes Did it involve sudden or severe rash/hives, skin peeling, or any reaction on the inside of your mouth or nose? no Did you need to seek medical attention at a hospital or doctor's office? yes When did it last happen?20 years If all above answers are "NO", may proceed with cephalosporin use.   Sulfa Antibiotics Anaphylaxis   Sulfa Antibiotics Anaphylaxis, Swelling      Medication List    STOP taking these medications   cloNIDine 0.2 MG tablet Commonly known as: CATAPRES   hydrochlorothiazide  12.5 MG capsule Commonly known as: MICROZIDE   ibuprofen 200 MG tablet Commonly known as: ADVIL   omeprazole 40 MG capsule Commonly known as: PRILOSEC     TAKE these medications   acetaminophen 325 MG tablet Commonly known as: TYLENOL Take 2 tablets (650 mg total) by mouth every 6 (six) hours as needed for mild pain (or Fever >/= 101).   amLODipine 10 MG tablet Commonly known  as: NORVASC Take 10 mg by mouth daily. What changed: Another medication with the same name was removed. Continue taking this medication, and follow the directions you see here.   clonazePAM 1 MG tablet Commonly known as: KLONOPIN Take 1 mg by mouth at bedtime.   cloNIDine 0.2 mg/24hr patch Commonly known as: CATAPRES - Dosed in mg/24 hr Place 1 patch (0.2 mg total) onto the skin once a week. Start taking on: August 06, 2019 Notes to patient: Take as prescribed   hydrOXYzine 50 MG tablet Commonly known as: ATARAX/VISTARIL Take 50 mg by mouth at bedtime as needed. What changed: Another medication with the same name was removed. Continue taking this medication, and follow the directions you see here.   metoCLOPramide 10 MG tablet Commonly known as: REGLAN Take 10 mg by mouth daily.   metoprolol succinate 50 MG 24 hr tablet Commonly known as: TOPROL-XL Take 50 mg by mouth daily. Take with or immediately following a meal. What changed: Another medication with the same name was removed. Continue taking this medication, and follow the directions you see here.   mirtazapine 30 MG tablet Commonly known as: REMERON Take 30 mg by mouth at bedtime. What changed: Another medication with the same name was removed. Continue taking this medication, and follow the directions you see here.   ondansetron 4 MG disintegrating tablet Commonly known as: ZOFRAN-ODT Take 4 mg by mouth every 8 (eight) hours as needed for nausea or vomiting.   PARoxetine 20 MG tablet Commonly known as: PAXIL Take 20 mg by mouth at bedtime. What changed: Another medication with the same name was removed. Continue taking this medication, and follow the directions you see here.   polyethylene glycol 17 g packet Commonly known as: MIRALAX / GLYCOLAX Take 17 g by mouth daily as needed for mild constipation.   promethazine 25 MG tablet Commonly known as: PHENERGAN Take 25 mg by mouth every 8 (eight) hours as  needed. What changed: Another medication with the same name was removed. Continue taking this medication, and follow the directions you see here.   sucralfate 1 GM/10ML suspension Commonly known as: CARAFATE Take 10 mLs (1 g total) by mouth 4 (four) times daily -  with meals and at bedtime.   SUMAtriptan 100 MG tablet Commonly known as: IMITREX Take 100 mg by mouth every 2 (two) hours as needed for migraine. May repeat in 2 hours if headache persists or recurs. What changed: Another medication with the same name was removed. Continue taking this medication, and follow the directions you see here.   temazepam 30 MG capsule Commonly known as: RESTORIL Take 30 mg by mouth at bedtime as needed for sleep.   triamcinolone cream 0.1 % Commonly known as: KENALOG Apply 1 application topically as needed.       Allergies  Allergen Reactions  . Amoxicillin Anaphylaxis  . Penicillins Anaphylaxis  . Penicillins Anaphylaxis and Swelling    Did it involve swelling of the face/tongue/throat, SOB, or low BP? yes Did it involve sudden or severe rash/hives, skin peeling, or  any reaction on the inside of your mouth or nose? no Did you need to seek medical attention at a hospital or doctor's office? yes When did it last happen?20 years If all above answers are "NO", may proceed with cephalosporin use.  . Sulfa Antibiotics Anaphylaxis  . Sulfa Antibiotics Anaphylaxis and Swelling    Consultations: GI Dr. Lyndel Safe  Procedures/Studies: CT HEAD W & WO CONTRAST  Result Date: 08/01/2019 CLINICAL DATA:  Chronic headache EXAM: CT HEAD WITHOUT AND WITH CONTRAST TECHNIQUE: Contiguous axial images were obtained from the base of the skull through the vertex without and with intravenous contrast CONTRAST:  1100mL OMNIPAQUE IOHEXOL 300 MG/ML  SOLN COMPARISON:  None. FINDINGS: Brain: No evidence of acute infarction, hemorrhage, hydrocephalus, extra-axial collection or mass lesion/mass effect. Normal  enhancement following contrast administration. Vascular: Negative for hyperdense vessel. Normal venous enhancement. Skull: Negative Sinuses/Orbits: Negative Other: None IMPRESSION: Negative CT head with contrast. Electronically Signed   By: Franchot Gallo M.D.   On: 08/01/2019 15:23   DG Abd Portable 1V  Result Date: 07/30/2019 CLINICAL DATA:  49 year old female with nausea vomiting. EXAM: PORTABLE ABDOMEN - 1 VIEW COMPARISON:  None. FINDINGS: Top-normal bowel loops measuring up to 2.7 cm in the left hemiabdomen. No bowel dilatation or evidence of obstruction. No free air. Right upper quadrant cholecystectomy clips. No acute osseous pathology. IMPRESSION: No bowel dilatation or evidence of obstruction. Electronically Signed   By: Anner Crete M.D.   On: 07/30/2019 19:13    (Echo, Carotid, EGD, Colonoscopy, ERCP)    Subjective:  Patient is resting in bed awake alert staff reports no overnight events she tolerated breakfast Discharge Exam: Vitals:   08/02/19 0534 08/02/19 0913  BP: 114/74 118/80  Pulse: 94 96  Resp: 17   Temp: 97.7 F (36.5 C) 98.4 F (36.9 C)  SpO2: 90% 98%   Vitals:   08/01/19 1407 08/01/19 2053 08/02/19 0534 08/02/19 0913  BP: 127/76 117/66 114/74 118/80  Pulse: 89 96 94 96  Resp:  17 17   Temp: 98.7 F (37.1 C) 98.2 F (36.8 C) 97.7 F (36.5 C) 98.4 F (36.9 C)  TempSrc: Oral Oral Oral Oral  SpO2: 92% 97% 90% 98%  Weight:      Height:        General: Pt is alert, awake, not in acute distress Cardiovascular: RRR, S1/S2 +, no rubs, no gallops Respiratory: CTA bilaterally, no wheezing, no rhonchi Abdominal: Soft, NT, ND, bowel sounds + Extremities: no edema, no cyanosis    The results of significant diagnostics from this hospitalization (including imaging, microbiology, ancillary and laboratory) are listed below for reference.     Microbiology: Recent Results (from the past 240 hour(s))  Urine culture     Status: Abnormal   Collection Time:  07/30/19 12:15 PM   Specimen: Urine, Random  Result Value Ref Range Status   Specimen Description   Final    URINE, RANDOM Performed at [place] [address], [place], [place] [address]    Special Requests   Final    NONE Performed at [facility], [place] [address] [place], [place] [address]    Culture MULTIPLE SPECIES PRESENT, SUGGEST RECOLLECTION (A)  Final   Report Status 08/01/2019 FINAL  Final  Respiratory Panel by RT PCR (Flu A&B, Covid) - Nasopharyngeal Swab     Status: None   Collection Time: 07/30/19  3:12 PM   Specimen: Nasopharyngeal Swab  Result Value Ref Range Status   SARS  Coronavirus 2 by RT PCR NEGATIVE NEGATIVE Final    Comment: (NOTE) SARS-CoV-2 target nucleic acids are NOT DETECTED. The SARS-CoV-2 RNA is generally detectable in upper respiratoy specimens during the acute phase of infection. The lowest concentration of SARS-CoV-2 viral copies this assay can detect is 131 copies/mL. A negative result does not preclude SARS-Cov-2 infection and should not be used as the sole basis for treatment or other patient management decisions. A negative result may occur with  improper specimen collection/handling, submission of specimen other than nasopharyngeal swab, presence of viral mutation(s) within the areas targeted by this assay, and inadequate number of viral copies (<131 copies/mL). A negative result must be combined with clinical observations, patient history, and epidemiological information. The expected result is Negative. Fact Sheet for Patients:  PinkCheek.be Fact Sheet for Healthcare Providers:  GravelBags.it This test is not yet ap proved or cleared by the [place] FDA and  has been authorized for detection and/or diagnosis of SARS-CoV-2 by FDA under an Emergency Use Authorization (EUA). This EUA will remain  in effect (meaning this test can be used)  for the duration of the COVID-19 declaration under Section 564(b)(1) of the Act, 21 U.S.C. section 360bbb-3(b)(1), unless the authorization is terminated or revoked sooner.    Influenza A by PCR NEGATIVE NEGATIVE Final   Influenza B by PCR NEGATIVE NEGATIVE Final    Comment: (NOTE) The Xpert Xpress SARS-CoV-2/FLU/RSV assay is intended as an aid in  the diagnosis of influenza from Nasopharyngeal swab specimens and  should not be used as a sole basis for treatment. Nasal washings and  aspirates are unacceptable for Xpert Xpress SARS-CoV-2/FLU/RSV  testing. Fact Sheet for Patients: PinkCheek.be Fact Sheet for Healthcare Providers: GravelBags.it This test is not yet approved or cleared by the [place] FDA and  has been authorized for detection and/or diagnosis of SARS-CoV-2 by  FDA under an Emergency Use Authorization (EUA). This EUA will remain  in effect (meaning this test can be used) for the duration of the  Covid-19 declaration under Section 564(b)(1) of the Act, 21  U.S.C. section 360bbb-3(b)(1), unless the authorization is  terminated or revoked. Performed at [facility], [place] [address], [place], [place] [address]      Labs: BNP (last 3 results) No results for input(s): BNP in the last 8760 hours. Basic Metabolic Panel: Recent Labs  Lab 07/30/19 1034 07/31/19 0452 08/01/19 0501  NA 140 141 140  K 4.2 4.1 4.6  CL 104 108 110  CO2 24 24 23   GLUCOSE 129* 108* 106*  BUN 20 18 11   CREATININE 1.51* 0.95 0.90  CALCIUM 9.5 8.5* 8.5*  MG 2.2  --   --   PHOS 3.3  --   --    Liver Function Tests: Recent Labs  Lab 07/30/19 1034 07/31/19 0452  AST 20 19  ALT 21 18  ALKPHOS 72 56  BILITOT 0.7 0.6  PROT 8.2* 6.5  ALBUMIN 4.9 3.9   Recent Labs  Lab 07/30/19 1034  LIPASE 28   No results for input(s): AMMONIA in the last 168 hours. CBC: Recent Labs  Lab 07/30/19 1034 07/31/19 0452  08/01/19 0501  WBC 17.7* 12.6* 11.3*  NEUTROABS 13.2*  --   --   HGB 14.8 13.0 12.7  HCT 45.5 41.6 40.6  MCV 96.8 99.8 99.5  PLT 380 307 303   Cardiac Enzymes: No results for input(s): CKTOTAL, CKMB, CKMBINDEX, TROPONINI in the last 168 hours. BNP: Invalid input(s): POCBNP CBG: No  results for input(s): GLUCAP in the last 168 hours. D-Dimer No results for input(s): DDIMER in the last 72 hours. Hgb A1c No results for input(s): HGBA1C in the last 72 hours. Lipid Profile No results for input(s): CHOL, HDL, LDLCALC, TRIG, CHOLHDL, LDLDIRECT in the last 72 hours. Thyroid function studies Recent Labs    07/30/19 1034  TSH 0.417   Anemia work up No results for input(s): VITAMINB12, FOLATE, FERRITIN, TIBC, IRON, RETICCTPCT in the last 72 hours. Urinalysis    Component Value Date/Time   COLORURINE AMBER (A) 07/30/2019 1017   APPEARANCEUR CLOUDY (A) 07/30/2019 1017   LABSPEC 1.019 07/30/2019 1017   PHURINE 5.0 07/30/2019 1017   GLUCOSEU NEGATIVE 07/30/2019 1017   HGBUR NEGATIVE 07/30/2019 1017   BILIRUBINUR NEGATIVE 07/30/2019 1017   KETONESUR 5 (A) 07/30/2019 1017   PROTEINUR >=300 (A) 07/30/2019 1017   NITRITE NEGATIVE 07/30/2019 1017   LEUKOCYTESUR TRACE (A) 07/30/2019 1017   Sepsis Labs Invalid input(s): PROCALCITONIN,  WBC,  LACTICIDVEN Microbiology Recent Results (from the past 240 hour(s))  Urine culture     Status: Abnormal   Collection Time: 07/30/19 12:15 PM   Specimen: Urine, Random  Result Value Ref Range Status   Specimen Description   Final    URINE, RANDOM Performed at [facility], [place] [address], [place], [place] [address]    Special Requests   Final    NONE Performed at [facility], [place] [address], [place], [place] [address]    Culture MULTIPLE SPECIES PRESENT, SUGGEST RECOLLECTION (A)  Final   Report Status 08/01/2019 FINAL  Final  Respiratory Panel by RT PCR (Flu A&B, Covid) - Nasopharyngeal Swab     Status:  None   Collection Time: 07/30/19  3:12 PM   Specimen: Nasopharyngeal Swab  Result Value Ref Range Status   SARS Coronavirus 2 by RT PCR NEGATIVE NEGATIVE Final    Comment: (NOTE) SARS-CoV-2 target nucleic acids are NOT DETECTED. The SARS-CoV-2 RNA is generally detectable in upper respiratoy specimens during the acute phase of infection. The lowest concentration of SARS-CoV-2 viral copies this assay can detect is 131 copies/mL. A negative result does not preclude SARS-Cov-2 infection and should not be used as the sole basis for treatment or other patient management decisions. A negative result may occur with  improper specimen collection/handling, submission of specimen other than nasopharyngeal swab, presence of viral mutation(s) within the areas targeted by this assay, and inadequate number of viral copies (<131 copies/mL). A negative result must be combined with clinical observations, patient history, and epidemiological information. The expected result is Negative. Fact Sheet for Patients:  PinkCheek.be Fact Sheet for Healthcare Providers:  GravelBags.it This test is not yet ap proved or cleared by the [place] FDA and  has been authorized for detection and/or diagnosis of SARS-CoV-2 by FDA under an Emergency Use Authorization (EUA). This EUA will remain  in effect (meaning this test can be used) for the duration of the COVID-19 declaration under Section 564(b)(1) of the Act, 21 U.S.C. section 360bbb-3(b)(1), unless the authorization is terminated or revoked sooner.    Influenza A by PCR NEGATIVE NEGATIVE Final   Influenza B by PCR NEGATIVE NEGATIVE Final    Comment: (NOTE) The Xpert Xpress SARS-CoV-2/FLU/RSV assay is intended as an aid in  the diagnosis of influenza from Nasopharyngeal swab specimens and  should not be used as a sole basis for treatment. Nasal washings and  aspirates are unacceptable for Xpert  Xpress SARS-CoV-2/FLU/RSV  testing.  Fact Sheet for Patients: PinkCheek.be Fact Sheet for Healthcare Providers: GravelBags.it This test is not yet approved or cleared by the [place] FDA and  has been authorized for detection and/or diagnosis of SARS-CoV-2 by  FDA under an Emergency Use Authorization (EUA). This EUA will remain  in effect (meaning this test can be used) for the duration of the  Covid-19 declaration under Section 564(b)(1) of the Act, 21  U.S.C. section 360bbb-3(b)(1), unless the authorization is  terminated or revoked. Performed at [facility], [place] [address] [place], [place] [address]      Time coordinating discharge:  38 minutes  SIGNED:   Georgette Shell, MD  [facility] 08/02/2019, 9:52 AM Pager   If 7PM-7AM, please contact night-coverage [email or web address] Password [email or web address]

## 2019-08-03 ENCOUNTER — Encounter: Payer: BC Managed Care – PPO | Admitting: Gastroenterology

## 2019-08-03 LAB — ALPHA GALACTOSIDASE: Alpha galactosidase, serum: 64.5 nmol/hr/mg prt (ref >35.5)

## 2019-08-09 DIAGNOSIS — K259 Gastric ulcer, unspecified as acute or chronic, without hemorrhage or perforation: Secondary | ICD-10-CM | POA: Diagnosis not present

## 2019-08-09 DIAGNOSIS — F3341 Major depressive disorder, recurrent, in partial remission: Secondary | ICD-10-CM | POA: Diagnosis not present

## 2019-08-09 DIAGNOSIS — R110 Nausea: Secondary | ICD-10-CM | POA: Diagnosis not present

## 2019-08-09 DIAGNOSIS — I10 Essential (primary) hypertension: Secondary | ICD-10-CM | POA: Diagnosis not present

## 2019-08-20 ENCOUNTER — Ambulatory Visit: Payer: BC Managed Care – PPO | Admitting: Gastroenterology

## 2019-08-30 DIAGNOSIS — G43909 Migraine, unspecified, not intractable, without status migrainosus: Secondary | ICD-10-CM | POA: Diagnosis not present

## 2019-08-30 DIAGNOSIS — F3341 Major depressive disorder, recurrent, in partial remission: Secondary | ICD-10-CM | POA: Diagnosis not present

## 2019-08-30 DIAGNOSIS — I10 Essential (primary) hypertension: Secondary | ICD-10-CM | POA: Diagnosis not present

## 2019-08-30 DIAGNOSIS — R110 Nausea: Secondary | ICD-10-CM | POA: Diagnosis not present

## 2019-09-06 DIAGNOSIS — N390 Urinary tract infection, site not specified: Secondary | ICD-10-CM | POA: Diagnosis not present

## 2019-09-06 DIAGNOSIS — I10 Essential (primary) hypertension: Secondary | ICD-10-CM | POA: Diagnosis not present

## 2019-09-06 DIAGNOSIS — G43909 Migraine, unspecified, not intractable, without status migrainosus: Secondary | ICD-10-CM | POA: Diagnosis not present

## 2019-09-06 DIAGNOSIS — R110 Nausea: Secondary | ICD-10-CM | POA: Diagnosis not present

## 2019-09-11 DIAGNOSIS — I10 Essential (primary) hypertension: Secondary | ICD-10-CM | POA: Diagnosis not present

## 2019-09-11 DIAGNOSIS — N390 Urinary tract infection, site not specified: Secondary | ICD-10-CM | POA: Diagnosis not present

## 2019-09-11 DIAGNOSIS — F5104 Psychophysiologic insomnia: Secondary | ICD-10-CM | POA: Diagnosis not present

## 2019-09-11 DIAGNOSIS — R110 Nausea: Secondary | ICD-10-CM | POA: Diagnosis not present

## 2019-09-29 DIAGNOSIS — F419 Anxiety disorder, unspecified: Secondary | ICD-10-CM | POA: Diagnosis not present

## 2019-09-29 DIAGNOSIS — F5104 Psychophysiologic insomnia: Secondary | ICD-10-CM | POA: Diagnosis not present

## 2019-09-29 DIAGNOSIS — I10 Essential (primary) hypertension: Secondary | ICD-10-CM | POA: Diagnosis not present

## 2019-09-29 DIAGNOSIS — R110 Nausea: Secondary | ICD-10-CM | POA: Diagnosis not present

## 2019-10-13 DIAGNOSIS — R110 Nausea: Secondary | ICD-10-CM | POA: Diagnosis not present

## 2019-10-13 DIAGNOSIS — R600 Localized edema: Secondary | ICD-10-CM | POA: Diagnosis not present

## 2019-10-13 DIAGNOSIS — G43909 Migraine, unspecified, not intractable, without status migrainosus: Secondary | ICD-10-CM | POA: Diagnosis not present

## 2019-10-13 DIAGNOSIS — F3341 Major depressive disorder, recurrent, in partial remission: Secondary | ICD-10-CM | POA: Diagnosis not present

## 2019-10-27 DIAGNOSIS — R110 Nausea: Secondary | ICD-10-CM | POA: Diagnosis not present

## 2019-10-27 DIAGNOSIS — F5104 Psychophysiologic insomnia: Secondary | ICD-10-CM | POA: Diagnosis not present

## 2019-10-27 DIAGNOSIS — Z202 Contact with and (suspected) exposure to infections with a predominantly sexual mode of transmission: Secondary | ICD-10-CM | POA: Diagnosis not present

## 2019-10-27 DIAGNOSIS — I10 Essential (primary) hypertension: Secondary | ICD-10-CM | POA: Diagnosis not present

## 2019-11-17 DIAGNOSIS — R600 Localized edema: Secondary | ICD-10-CM | POA: Diagnosis not present

## 2019-11-17 DIAGNOSIS — R110 Nausea: Secondary | ICD-10-CM | POA: Diagnosis not present

## 2019-11-17 DIAGNOSIS — I10 Essential (primary) hypertension: Secondary | ICD-10-CM | POA: Diagnosis not present

## 2019-11-17 DIAGNOSIS — N390 Urinary tract infection, site not specified: Secondary | ICD-10-CM | POA: Diagnosis not present

## 2019-11-17 DIAGNOSIS — G43909 Migraine, unspecified, not intractable, without status migrainosus: Secondary | ICD-10-CM | POA: Diagnosis not present

## 2019-11-24 DIAGNOSIS — R110 Nausea: Secondary | ICD-10-CM | POA: Diagnosis not present

## 2019-11-24 DIAGNOSIS — F3341 Major depressive disorder, recurrent, in partial remission: Secondary | ICD-10-CM | POA: Diagnosis not present

## 2019-11-24 DIAGNOSIS — I10 Essential (primary) hypertension: Secondary | ICD-10-CM | POA: Diagnosis not present

## 2019-11-24 DIAGNOSIS — G43909 Migraine, unspecified, not intractable, without status migrainosus: Secondary | ICD-10-CM | POA: Diagnosis not present

## 2019-12-21 DIAGNOSIS — R7989 Other specified abnormal findings of blood chemistry: Secondary | ICD-10-CM | POA: Diagnosis not present

## 2019-12-21 DIAGNOSIS — I10 Essential (primary) hypertension: Secondary | ICD-10-CM | POA: Diagnosis not present

## 2019-12-21 DIAGNOSIS — E785 Hyperlipidemia, unspecified: Secondary | ICD-10-CM | POA: Diagnosis not present

## 2019-12-21 DIAGNOSIS — R110 Nausea: Secondary | ICD-10-CM | POA: Diagnosis not present

## 2019-12-21 DIAGNOSIS — M791 Myalgia, unspecified site: Secondary | ICD-10-CM | POA: Diagnosis not present

## 2019-12-31 DIAGNOSIS — I10 Essential (primary) hypertension: Secondary | ICD-10-CM | POA: Diagnosis not present

## 2019-12-31 DIAGNOSIS — F419 Anxiety disorder, unspecified: Secondary | ICD-10-CM | POA: Diagnosis not present

## 2019-12-31 DIAGNOSIS — F3341 Major depressive disorder, recurrent, in partial remission: Secondary | ICD-10-CM | POA: Diagnosis not present

## 2019-12-31 DIAGNOSIS — Z1159 Encounter for screening for other viral diseases: Secondary | ICD-10-CM | POA: Diagnosis not present

## 2019-12-31 DIAGNOSIS — R1013 Epigastric pain: Secondary | ICD-10-CM | POA: Diagnosis not present

## 2020-01-11 DIAGNOSIS — F3341 Major depressive disorder, recurrent, in partial remission: Secondary | ICD-10-CM | POA: Diagnosis not present

## 2020-01-11 DIAGNOSIS — R1013 Epigastric pain: Secondary | ICD-10-CM | POA: Diagnosis not present

## 2020-01-11 DIAGNOSIS — I10 Essential (primary) hypertension: Secondary | ICD-10-CM | POA: Diagnosis not present

## 2020-01-11 DIAGNOSIS — F419 Anxiety disorder, unspecified: Secondary | ICD-10-CM | POA: Diagnosis not present

## 2020-01-19 DIAGNOSIS — I10 Essential (primary) hypertension: Secondary | ICD-10-CM | POA: Diagnosis not present

## 2020-01-19 DIAGNOSIS — R110 Nausea: Secondary | ICD-10-CM | POA: Diagnosis not present

## 2020-01-19 DIAGNOSIS — R1013 Epigastric pain: Secondary | ICD-10-CM | POA: Diagnosis not present

## 2020-01-19 DIAGNOSIS — F3341 Major depressive disorder, recurrent, in partial remission: Secondary | ICD-10-CM | POA: Diagnosis not present

## 2020-01-22 DIAGNOSIS — H16001 Unspecified corneal ulcer, right eye: Secondary | ICD-10-CM | POA: Diagnosis not present

## 2020-01-22 DIAGNOSIS — K219 Gastro-esophageal reflux disease without esophagitis: Secondary | ICD-10-CM | POA: Diagnosis not present

## 2020-01-22 DIAGNOSIS — F17210 Nicotine dependence, cigarettes, uncomplicated: Secondary | ICD-10-CM | POA: Diagnosis not present

## 2020-01-22 DIAGNOSIS — I10 Essential (primary) hypertension: Secondary | ICD-10-CM | POA: Diagnosis not present

## 2020-01-22 DIAGNOSIS — Z79899 Other long term (current) drug therapy: Secondary | ICD-10-CM | POA: Diagnosis not present

## 2020-01-22 DIAGNOSIS — H5711 Ocular pain, right eye: Secondary | ICD-10-CM | POA: Diagnosis not present

## 2020-01-23 DIAGNOSIS — H16031 Corneal ulcer with hypopyon, right eye: Secondary | ICD-10-CM | POA: Diagnosis not present

## 2020-01-24 DIAGNOSIS — H16031 Corneal ulcer with hypopyon, right eye: Secondary | ICD-10-CM | POA: Diagnosis not present

## 2020-01-25 DIAGNOSIS — H16031 Corneal ulcer with hypopyon, right eye: Secondary | ICD-10-CM | POA: Diagnosis not present

## 2020-01-28 DIAGNOSIS — H16031 Corneal ulcer with hypopyon, right eye: Secondary | ICD-10-CM | POA: Diagnosis not present

## 2020-02-04 DIAGNOSIS — H16031 Corneal ulcer with hypopyon, right eye: Secondary | ICD-10-CM | POA: Diagnosis not present

## 2020-02-12 DIAGNOSIS — H16031 Corneal ulcer with hypopyon, right eye: Secondary | ICD-10-CM | POA: Diagnosis not present

## 2020-02-14 DIAGNOSIS — R1013 Epigastric pain: Secondary | ICD-10-CM | POA: Diagnosis not present

## 2020-02-14 DIAGNOSIS — E785 Hyperlipidemia, unspecified: Secondary | ICD-10-CM | POA: Diagnosis not present

## 2020-02-14 DIAGNOSIS — G629 Polyneuropathy, unspecified: Secondary | ICD-10-CM | POA: Diagnosis not present

## 2020-02-14 DIAGNOSIS — F3341 Major depressive disorder, recurrent, in partial remission: Secondary | ICD-10-CM | POA: Diagnosis not present

## 2020-02-14 DIAGNOSIS — R110 Nausea: Secondary | ICD-10-CM | POA: Diagnosis not present

## 2020-03-10 DIAGNOSIS — H16031 Corneal ulcer with hypopyon, right eye: Secondary | ICD-10-CM | POA: Diagnosis not present

## 2020-03-13 DIAGNOSIS — R110 Nausea: Secondary | ICD-10-CM | POA: Diagnosis not present

## 2020-03-13 DIAGNOSIS — E785 Hyperlipidemia, unspecified: Secondary | ICD-10-CM | POA: Diagnosis not present

## 2020-03-13 DIAGNOSIS — R1013 Epigastric pain: Secondary | ICD-10-CM | POA: Diagnosis not present

## 2020-03-13 DIAGNOSIS — F3341 Major depressive disorder, recurrent, in partial remission: Secondary | ICD-10-CM | POA: Diagnosis not present

## 2020-03-13 DIAGNOSIS — R9431 Abnormal electrocardiogram [ECG] [EKG]: Secondary | ICD-10-CM | POA: Diagnosis not present

## 2020-03-27 DIAGNOSIS — R9431 Abnormal electrocardiogram [ECG] [EKG]: Secondary | ICD-10-CM | POA: Diagnosis not present

## 2020-03-27 DIAGNOSIS — F5104 Psychophysiologic insomnia: Secondary | ICD-10-CM | POA: Diagnosis not present

## 2020-03-27 DIAGNOSIS — F3341 Major depressive disorder, recurrent, in partial remission: Secondary | ICD-10-CM | POA: Diagnosis not present

## 2020-03-27 DIAGNOSIS — R110 Nausea: Secondary | ICD-10-CM | POA: Diagnosis not present

## 2020-03-27 DIAGNOSIS — R0600 Dyspnea, unspecified: Secondary | ICD-10-CM | POA: Diagnosis not present

## 2020-04-04 DIAGNOSIS — N390 Urinary tract infection, site not specified: Secondary | ICD-10-CM | POA: Diagnosis not present

## 2020-04-04 DIAGNOSIS — R110 Nausea: Secondary | ICD-10-CM | POA: Diagnosis not present

## 2020-04-04 DIAGNOSIS — G43909 Migraine, unspecified, not intractable, without status migrainosus: Secondary | ICD-10-CM | POA: Diagnosis not present

## 2020-04-04 DIAGNOSIS — R1013 Epigastric pain: Secondary | ICD-10-CM | POA: Diagnosis not present

## 2020-04-04 DIAGNOSIS — K259 Gastric ulcer, unspecified as acute or chronic, without hemorrhage or perforation: Secondary | ICD-10-CM | POA: Diagnosis not present

## 2021-02-18 IMAGING — CT CT HEAD WO/W CM
5 series · 17 of 47 positions shown, 19 images · IV contrast (APPLIED)
Comparison: None.

CLINICAL DATA: Chronic headache

EXAM:
CT HEAD WITHOUT AND WITH CONTRAST
TECHNIQUE: Contiguous axial images were obtained from the base of the skull
through the vertex without and with intravenous contrast
CONTRAST:  100mL OMNIPAQUE IOHEXOL 300 MG/ML  SOLN

[Series 2: head wo · axial · 0.47mm/px · z∈[-118,-28]mm · 4 of 31 slices shown]
[im 7/31  brain]
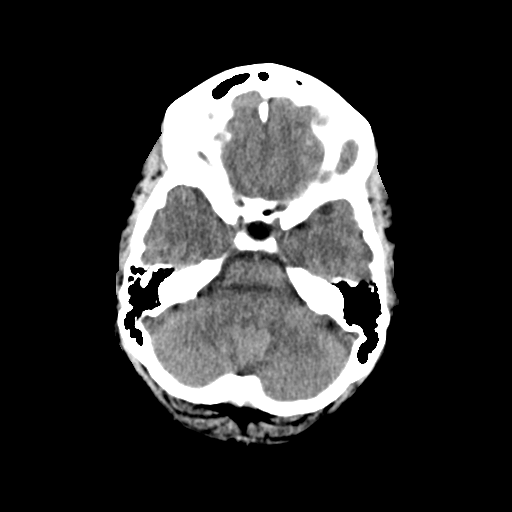
[im 13/31  brain]
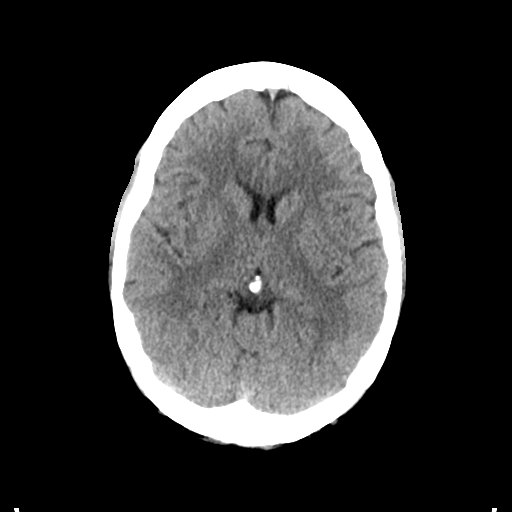
[im 19/31  brain]
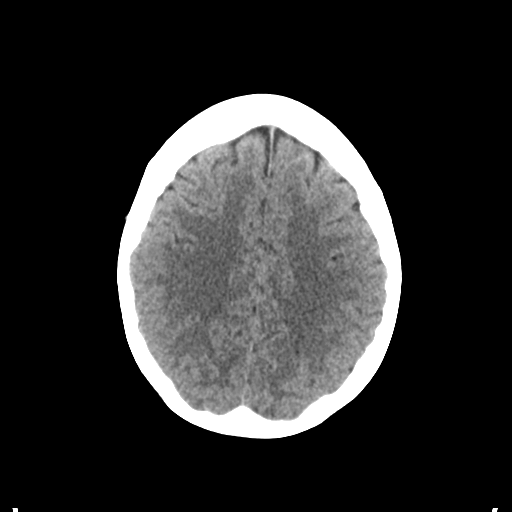
[im 25/31  brain]
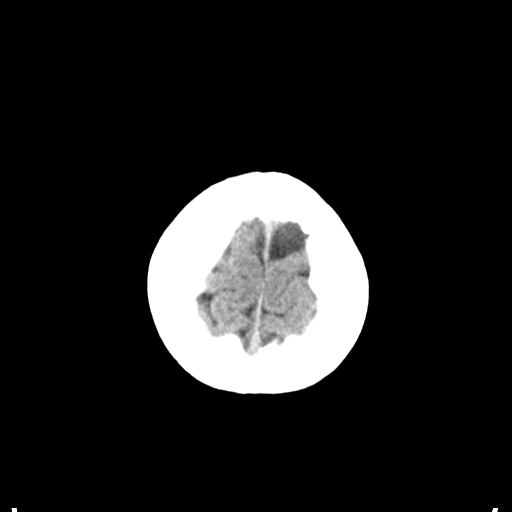

[Series 3: head bone · axial · 0.47mm/px · z∈[-140,-120]mm · 2 of 78 slices shown]
[im 5/78  bone]
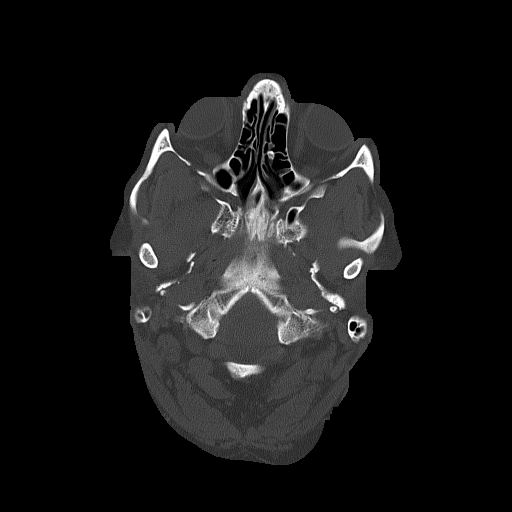
[im 15/78  bone]
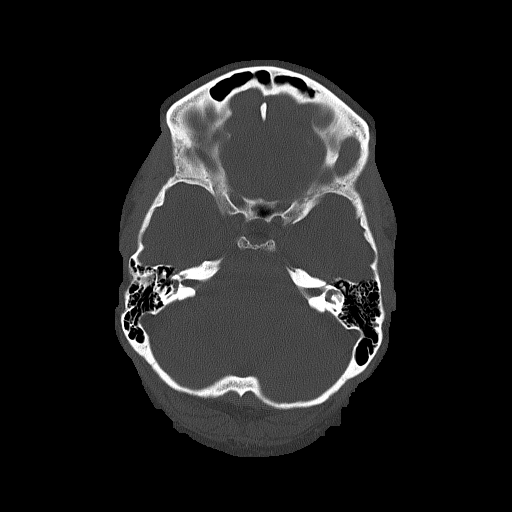

[Series 4: head w · axial · 0.47mm/px · z∈[-123,-23]mm · 5 of 31 slices shown, 7 images]
[im 6/31  brain]
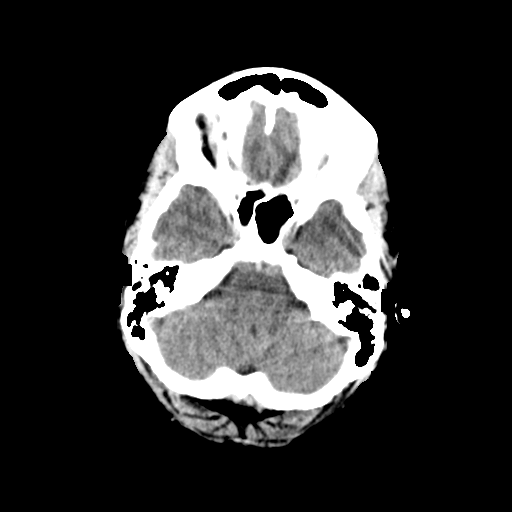
[im 6/31  bone]
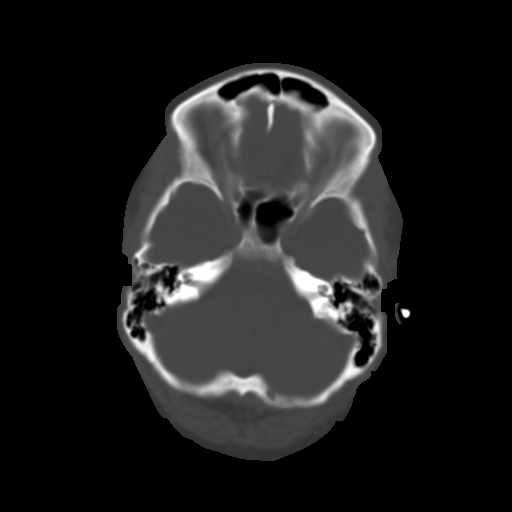
[im 11/31  brain]
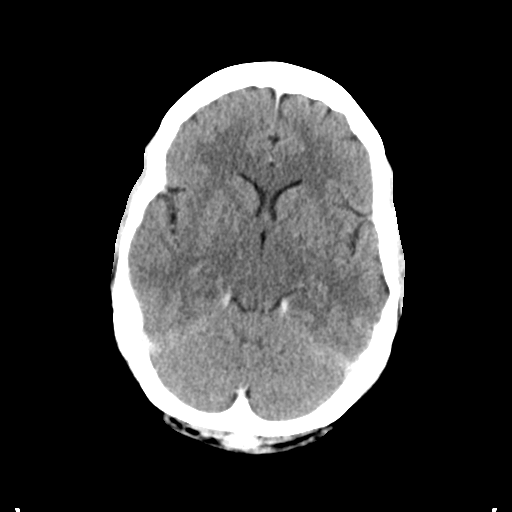
[im 16/31  brain]
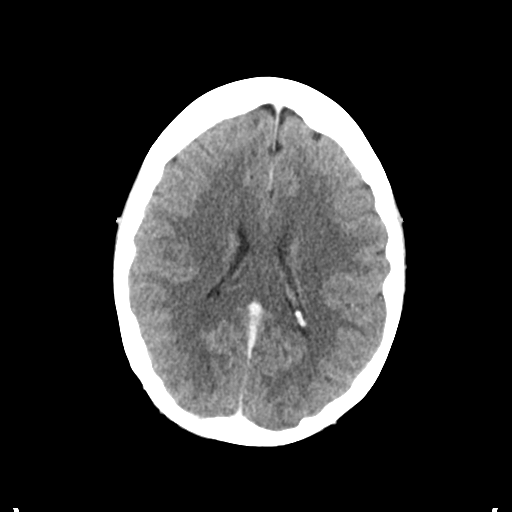
[im 21/31  brain]
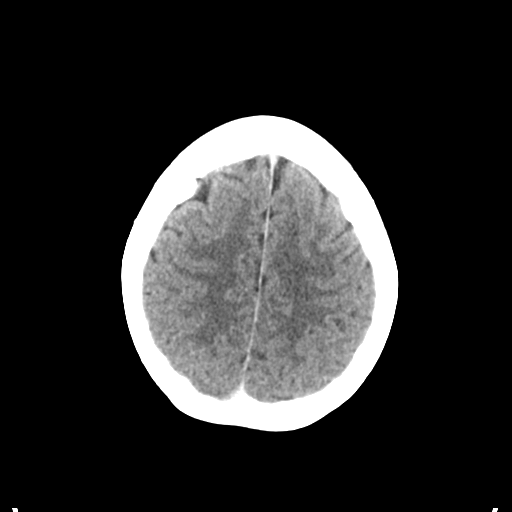
[im 26/31  brain]
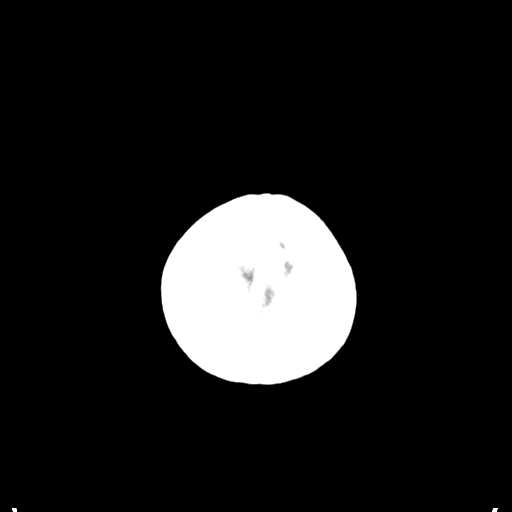
[im 26/31  bone]
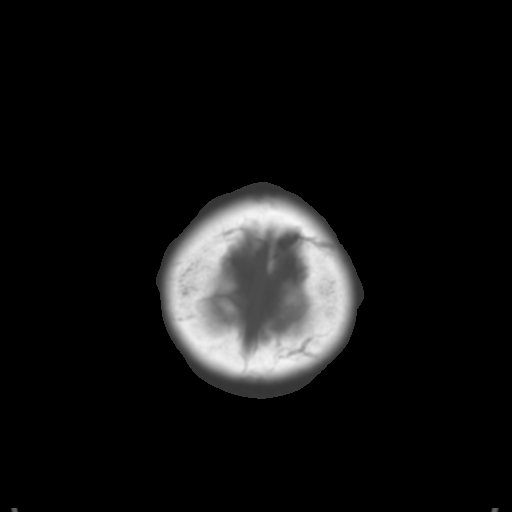

[Series 5: coronal soft tissue · coronal · 0.29mm/px · 3 of 63 slices shown]
[im 21/63  brain]
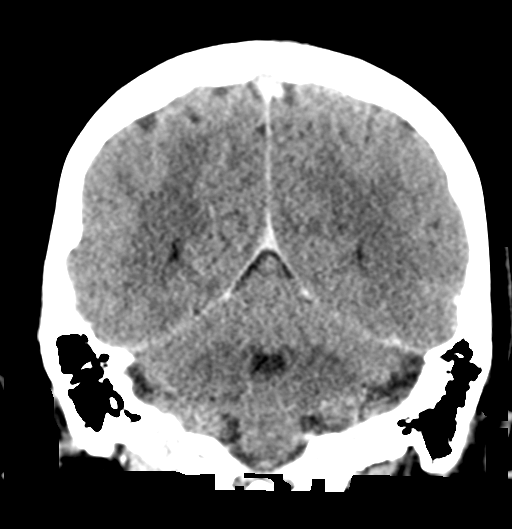
[im 28/63  brain]
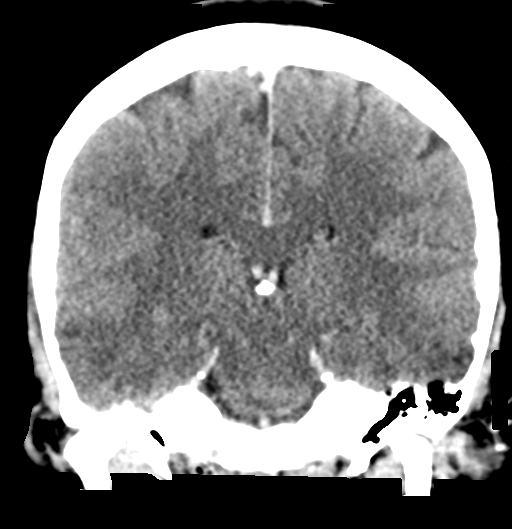
[im 35/63  brain]
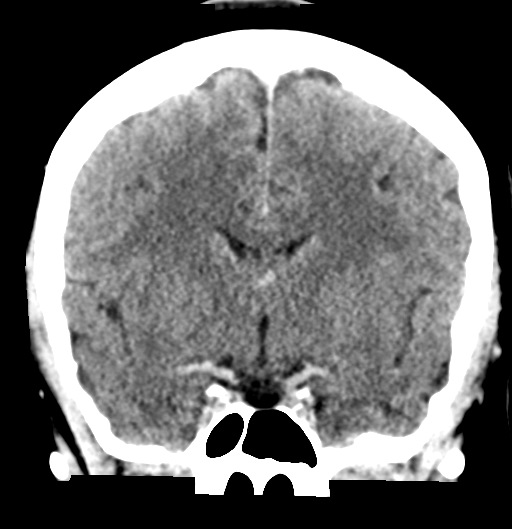

[Series 6: sagittal soft tissue · sagittal · 0.30mm/px · 3 of 53 slices shown]
[im 18/53  brain]
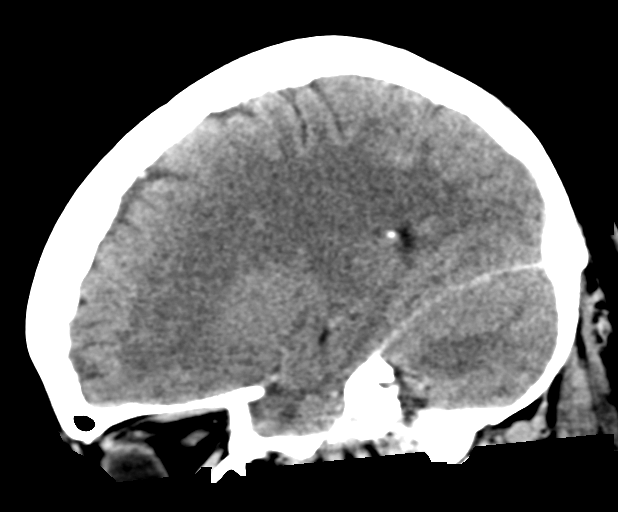
[im 27/53  brain]
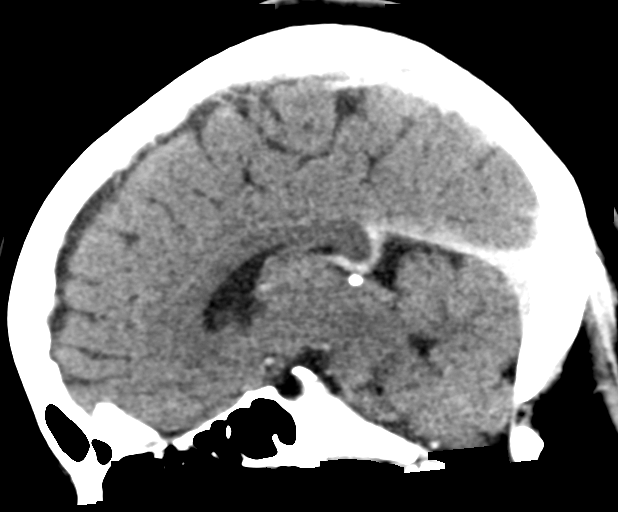
[im 35/53  brain]
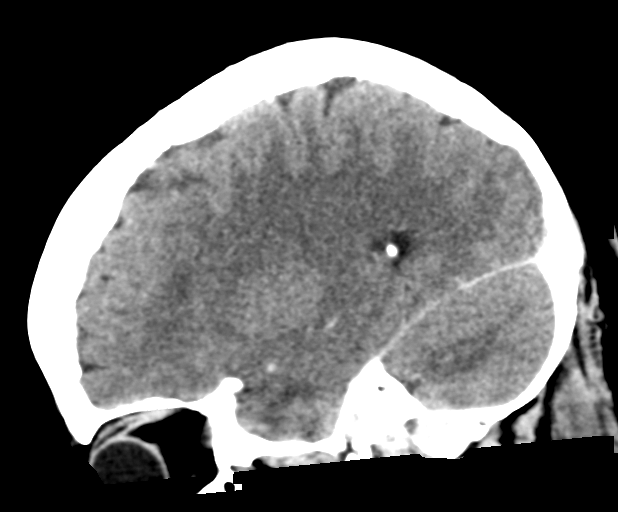

[17 of 47 positions shown; findings below may reference images not displayed]

FINDINGS: Brain: No evidence of acute infarction, hemorrhage, hydrocephalus,
extra-axial collection or mass lesion/mass effect.

Normal enhancement following contrast administration.

Vascular: Negative for hyperdense vessel. Normal venous enhancement.

Skull: Negative

Sinuses/Orbits: Negative

Other: None
IMPRESSION: Negative CT head with contrast.
